# Patient Record
Sex: Female | Born: 1960
Health system: Southern US, Community
[De-identification: ages and names within clinical notes are randomized; demographics above are authoritative.]

## PROBLEM LIST (undated history)

## (undated) DIAGNOSIS — M25473 Effusion, unspecified ankle: Secondary | ICD-10-CM

## (undated) DIAGNOSIS — E079 Disorder of thyroid, unspecified: Secondary | ICD-10-CM

## (undated) DIAGNOSIS — E039 Hypothyroidism, unspecified: Secondary | ICD-10-CM

## (undated) DIAGNOSIS — R519 Headache, unspecified: Secondary | ICD-10-CM

## (undated) DIAGNOSIS — N879 Dysplasia of cervix uteri, unspecified: Secondary | ICD-10-CM

## (undated) DIAGNOSIS — Z973 Presence of spectacles and contact lenses: Secondary | ICD-10-CM

## (undated) HISTORY — DX: Disorder of thyroid, unspecified: E07.9

## (undated) HISTORY — DX: Effusion, unspecified ankle: M25.473

## (undated) HISTORY — DX: Dysplasia of cervix uteri, unspecified: N87.9

## (undated) HISTORY — PX: TUBAL LIGATION: SHX77

---

## 1995-03-30 HISTORY — PX: CERVICAL CONE BIOPSY: SUR198

## 1999-01-29 ENCOUNTER — Other Ambulatory Visit: Admission: RE | Admit: 1999-01-29 | Discharge: 1999-01-29 | Payer: Self-pay | Admitting: Obstetrics and Gynecology

## 1999-03-09 ENCOUNTER — Other Ambulatory Visit: Admission: RE | Admit: 1999-03-09 | Discharge: 1999-03-09 | Payer: Self-pay | Admitting: Obstetrics and Gynecology

## 2012-03-29 DIAGNOSIS — D649 Anemia, unspecified: Secondary | ICD-10-CM

## 2012-03-29 HISTORY — DX: Anemia, unspecified: D64.9

## 2012-10-26 ENCOUNTER — Other Ambulatory Visit (HOSPITAL_COMMUNITY)
Admission: RE | Admit: 2012-10-26 | Discharge: 2012-10-26 | Disposition: A | Discharge status: Home / Self Care | Payer: BC Managed Care – PPO | Source: Ambulatory Visit | Attending: Gynecology | Admitting: Gynecology

## 2012-10-26 ENCOUNTER — Ambulatory Visit (INDEPENDENT_AMBULATORY_CARE_PROVIDER_SITE_OTHER): Payer: BC Managed Care – PPO | Admitting: Gynecology

## 2012-10-26 ENCOUNTER — Encounter: Payer: Self-pay | Admitting: Gynecology

## 2012-10-26 VITALS — BP 124/78 | Ht 69.0 in | Wt 242.0 lb

## 2012-10-26 DIAGNOSIS — Z13220 Encounter for screening for lipoid disorders: Secondary | ICD-10-CM

## 2012-10-26 DIAGNOSIS — Z1151 Encounter for screening for human papillomavirus (HPV): Secondary | ICD-10-CM | POA: Insufficient documentation

## 2012-10-26 DIAGNOSIS — N951 Menopausal and female climacteric states: Secondary | ICD-10-CM

## 2012-10-26 DIAGNOSIS — Z01419 Encounter for gynecological examination (general) (routine) without abnormal findings: Secondary | ICD-10-CM | POA: Insufficient documentation

## 2012-10-26 DIAGNOSIS — E039 Hypothyroidism, unspecified: Secondary | ICD-10-CM

## 2012-10-26 LAB — COMPREHENSIVE METABOLIC PANEL
BUN: 10 mg/dL (ref 6–23)
CO2: 23 mEq/L (ref 19–32)
Calcium: 8.9 mg/dL (ref 8.4–10.5)
Chloride: 106 mEq/L (ref 96–112)
Creat: 0.88 mg/dL (ref 0.50–1.10)
Glucose, Bld: 89 mg/dL (ref 70–99)

## 2012-10-26 LAB — CBC WITH DIFFERENTIAL/PLATELET
Basophils Absolute: 0.1 10*3/uL (ref 0.0–0.1)
Eosinophils Relative: 3 % (ref 0–5)
HCT: 36 % (ref 36.0–46.0)
Lymphocytes Relative: 25 % (ref 12–46)
Lymphs Abs: 2.6 10*3/uL (ref 0.7–4.0)
MCV: 69.9 fL — ABNORMAL LOW (ref 78.0–100.0)
Monocytes Absolute: 0.5 10*3/uL (ref 0.1–1.0)
RDW: 18 % — ABNORMAL HIGH (ref 11.5–15.5)
WBC: 10.2 10*3/uL (ref 4.0–10.5)

## 2012-10-26 LAB — LIPID PANEL
Cholesterol: 164 mg/dL (ref 0–200)
HDL: 47 mg/dL (ref >39)
Total CHOL/HDL Ratio: 3.5 Ratio

## 2012-10-26 MED ORDER — LEVOTHYROXINE SODIUM 112 MCG PO TABS: 112.0000 ug | ORAL_TABLET | Freq: Every day | ORAL | Status: DC

## 2012-10-26 NOTE — Addendum Note (Signed)
Addended by: Dayna Barker on: 10/26/2012 10:40 AM   Modules accepted: Orders

## 2012-10-26 NOTE — Patient Instructions (Signed)
Schedule screening colonoscopy with [facility] gastroenterology or [facility] gastroenterology.   Call to Schedule your mammogram  Facilities in [place]: 1)  [facility], [place] Phone: [phone] 2)  [facility], [address] [id number] Phone: [phone] 3)  Dr. Yolanda Bonine at [facility] [address] Phone: [phone]     Mammogram A mammogram is an X-ray test to find changes in a woman's breast. You should get a mammogram if:  You are 63 years of age or older  You have risk factors.   Your doctor recommends that you have one.  BEFORE THE TEST  Do not schedule the test the week before your period, especially if your breasts are sore during this time.  On the day of your mammogram:  Wash your breasts and armpits well. After washing, do not put on any deodorant or talcum powder on until after your test.   Eat and drink as you usually do.   Take your medicines as usual.   If you are diabetic and take insulin, make sure you:   Eat before coming for your test.   Take your insulin as usual.   If you cannot keep your appointment, call before the appointment to cancel. Schedule another appointment.  TEST  You will need to undress from the waist up. You will put on a hospital gown.   Your breast will be put on the mammogram machine, and it will press firmly on your breast with a piece of plastic called a compression paddle. This will make your breast flatter so that the machine can X-ray all parts of your breast.   Both breasts will be X-rayed. Each breast will be X-rayed from above and from the side. An X-ray might need to be taken again if the picture is not good enough.   The mammogram will last about 15 to 30 minutes.  AFTER THE TEST Finding out the results of your test Ask when your test results will be ready. Make sure you get your test results.  Document Released:  06/11/2008 Document Revised: 03/04/2011 Document Reviewed: 06/11/2008 [facility] Patient Information 2012 [place], [place].

## 2012-10-26 NOTE — Progress Notes (Signed)
Sabrina Donaldson 06-17-1960 [id number]        52 y.o.  [id number] for annual exam.  Former patient of Dr. Leota Sauers. Patient without complaints.  Past medical history,surgical history, medications, allergies, family history and social history were all reviewed and documented in the EPIC chart.  ROS:  Performed and pertinent positives and negatives are included in the history, assessment and plan .  Exam: Kim assistant Filed Vitals:   10/26/12 1001  BP: 124/78  Height: 5\' 9"  (1.753 m)  Weight: 242 lb (109.77 kg)   General appearance  Normal Skin grossly normal Head/Neck normal with no cervical or supraclavicular adenopathy thyroid normal Lungs  clear Cardiac RR, without RMG Abdominal  soft, nontender, without masses, organomegaly or hernia Breasts  examined lying and sitting without masses, retractions, discharge or axillary adenopathy. Pelvic  Ext/BUS/vagina  normal   Cervix  normal Pap/HPV  Uterus  anteverted, normal size, shape and contour, midline and mobile nontender   Adnexa  Without masses or tenderness    Anus and perineum  normal   Rectovaginal  normal sphincter tone without palpated masses or tenderness.    Assessment/Plan:  52 y.o. [id number] female for annual exam, regular menses, tubal sterilization.   1. Mild intermittent sweats. Not overly bothersome. Regular menses. Discussed the perimenopausal time frame. Trial of OTC soy. Check FSH today to see where we stand. Keep menstrual calendar. As long as regular or less frequent but regular menses and will monitor. Prolonged or atypical bleeding or significant symptoms patient will followup. 2. Hypothyroidism. On Synthroid 112 mcg replacement for years. Refill x1 year provided. Check TSH today. 3. Pap smear/HPV today. History of cone biopsy 1997 with pathology showing CIN 2. Followup CIN-1 on colposcopic biopsies 2000. All subsequent Pap smears have been normal with last Pap smear 2013. Assuming this Pap smear/HPV negative  then we'll plan less frequent screening interval. 4. Mammography overdue. Patient knows the importance of scheduling and agrees to do so. SBE monthly reviewed. 5. Colonoscopy screening recommended with names given. 6. Health maintenance. Baseline CBC lipid profile comprehensive metabolic panel urinalysis ordered along with her FSH TSH. Followup in one year, sooner as needed. Patient did bring copies of her prior medical records which I reviewed and then returned to her.  Note: This document was prepared with digital dictation and possible smart phrase technology. Any transcriptional errors that result from this process are unintentional.   Sabrina Lords MD, 10:30 AM 10/26/2012

## 2012-10-27 LAB — URINALYSIS W MICROSCOPIC + REFLEX CULTURE
Bacteria, UA: NONE SEEN
Casts: NONE SEEN
Crystals: NONE SEEN
Glucose, UA: NEGATIVE mg/dL
Hgb urine dipstick: NEGATIVE
Ketones, ur: NEGATIVE mg/dL
Leukocytes, UA: NEGATIVE
pH: 6 (ref 5.0–8.0)

## 2012-10-27 LAB — FOLLICLE STIMULATING HORMONE: FSH: 3.3 m[IU]/mL

## 2012-11-09 ENCOUNTER — Other Ambulatory Visit: Payer: Self-pay | Admitting: Gynecology

## 2012-11-09 DIAGNOSIS — D649 Anemia, unspecified: Secondary | ICD-10-CM

## 2012-11-09 DIAGNOSIS — E039 Hypothyroidism, unspecified: Secondary | ICD-10-CM

## 2012-11-10 ENCOUNTER — Other Ambulatory Visit: Payer: Self-pay | Admitting: Gynecology

## 2012-11-10 MED ORDER — LEVOTHYROXINE SODIUM 125 MCG PO TABS: 125.0000 ug | ORAL_TABLET | Freq: Every day | ORAL | Status: DC

## 2013-02-01 ENCOUNTER — Other Ambulatory Visit: Payer: Self-pay

## 2013-03-07 ENCOUNTER — Other Ambulatory Visit: Payer: BC Managed Care – PPO

## 2013-03-07 DIAGNOSIS — D649 Anemia, unspecified: Secondary | ICD-10-CM

## 2013-03-07 DIAGNOSIS — E039 Hypothyroidism, unspecified: Secondary | ICD-10-CM

## 2013-03-07 LAB — CBC WITH DIFFERENTIAL/PLATELET
Eosinophils Absolute: 0.4 10*3/uL (ref 0.0–0.7)
Eosinophils Relative: 4 % (ref 0–5)
Lymphs Abs: 2.1 10*3/uL (ref 0.7–4.0)
MCH: 23.5 pg — ABNORMAL LOW (ref 26.0–34.0)
MCV: 72.2 fL — ABNORMAL LOW (ref 78.0–100.0)
Monocytes Relative: 5 % (ref 3–12)
Platelets: 440 10*3/uL — ABNORMAL HIGH (ref 150–400)
RBC: 4.97 MIL/uL (ref 3.87–5.11)

## 2013-04-03 ENCOUNTER — Telehealth: Payer: Self-pay

## 2013-04-03 NOTE — Telephone Encounter (Signed)
Message copied by Keenan BachelorANNAS, Virdia Ziesmer R on Tue Apr 03, 2013 11:59 AM ------      Message from: Dara LordsFONTAINE, TIMOTHY P      Created: Wed Mar 07, 2013  4:35 PM       Tell patient that her thyroid is better. Her hemoglobin is still low. Is she taking iron every day? ------

## 2013-04-03 NOTE — Telephone Encounter (Signed)
Patient informed. 

## 2013-04-03 NOTE — Telephone Encounter (Signed)
Patient said when you told her previously her iron was low she was not very concerned because it had never been low before. Always 12 or 13.  She said her mother had MDS (?).  Patient is now concerned since it is continuing to be abnormal and questions what would suddenly cause her to be anemic after always having had normal hgb?    She has not been taking her iron regularly, only occasionally when she thinks of it.

## 2013-04-03 NOTE — Telephone Encounter (Signed)
It was 11.7 which isn't much difference from 12. I suspect is because of her menses and ongoing blood loss and not keeping up with the iron intake. Again my recommendation is to start on over-the-counter iron supplement daily and to stay on it and then repeat her blood count in July when she comes in for her annual. I would also recommend that she schedule her screening colonoscopy as her intestines can be a source of blood loss also. But overall I think it's just that she has not been taking iron and keeping up with her blood loss from her menses.

## 2013-04-13 ENCOUNTER — Other Ambulatory Visit: Payer: Self-pay | Admitting: Gynecology

## 2013-07-09 ENCOUNTER — Other Ambulatory Visit: Payer: Self-pay | Admitting: Gynecology

## 2013-07-09 DIAGNOSIS — Z1231 Encounter for screening mammogram for malignant neoplasm of breast: Secondary | ICD-10-CM

## 2013-07-10 ENCOUNTER — Ambulatory Visit (HOSPITAL_COMMUNITY): Payer: BC Managed Care – PPO

## 2013-07-12 ENCOUNTER — Ambulatory Visit (HOSPITAL_COMMUNITY)
Admission: RE | Admit: 2013-07-12 | Discharge: 2013-07-12 | Disposition: A | Discharge status: Home / Self Care | Payer: BC Managed Care – PPO | Source: Ambulatory Visit | Attending: Gynecology | Admitting: Gynecology

## 2013-07-12 ENCOUNTER — Other Ambulatory Visit: Payer: Self-pay | Admitting: Gynecology

## 2013-07-12 DIAGNOSIS — Z1231 Encounter for screening mammogram for malignant neoplasm of breast: Secondary | ICD-10-CM

## 2013-07-31 ENCOUNTER — Encounter: Payer: Self-pay | Admitting: Gynecology

## 2013-07-31 ENCOUNTER — Ambulatory Visit (INDEPENDENT_AMBULATORY_CARE_PROVIDER_SITE_OTHER): Payer: BC Managed Care – PPO | Admitting: Gynecology

## 2013-07-31 ENCOUNTER — Telehealth: Payer: Self-pay | Admitting: *Deleted

## 2013-07-31 DIAGNOSIS — N390 Urinary tract infection, site not specified: Secondary | ICD-10-CM

## 2013-07-31 DIAGNOSIS — R350 Frequency of micturition: Secondary | ICD-10-CM

## 2013-07-31 DIAGNOSIS — B3731 Acute candidiasis of vulva and vagina: Secondary | ICD-10-CM

## 2013-07-31 DIAGNOSIS — B373 Candidiasis of vulva and vagina: Secondary | ICD-10-CM

## 2013-07-31 LAB — URINALYSIS W MICROSCOPIC + REFLEX CULTURE
BILIRUBIN URINE: NEGATIVE
CRYSTALS: NONE SEEN
Casts: NONE SEEN
GLUCOSE, UA: NEGATIVE mg/dL
KETONES UR: NEGATIVE mg/dL
NITRITE: POSITIVE — AB
PH: 5 (ref 5.0–8.0)
Protein, ur: 30 mg/dL — AB
SPECIFIC GRAVITY, URINE: 1.015 (ref 1.005–1.030)
Urobilinogen, UA: 0.2 mg/dL (ref 0.0–1.0)

## 2013-07-31 MED ORDER — FLUCONAZOLE 150 MG PO TABS
150.0000 mg | ORAL_TABLET | Freq: Once | ORAL | Status: DC
Start: 2013-07-31 — End: 2013-10-31

## 2013-07-31 MED ORDER — BUSPIRONE HCL 10 MG PO TABS: 10.0000 mg | ORAL_TABLET | Freq: Every day | ORAL | Status: DC

## 2013-07-31 MED ORDER — SULFAMETHOXAZOLE-TMP DS 800-160 MG PO TABS: 1.0000 | ORAL_TABLET | Freq: Two times a day (BID) | ORAL | Status: DC

## 2013-07-31 NOTE — Patient Instructions (Signed)
Take the Septra antibiotic twice daily for 3 days. Call if her symptoms persist, worsen or recur. Take the Diflucan pill once. Start the BuSpar 10 mg daily. If you have worsening anxiety or depression call. Followup in July when you're due for your annual exam.

## 2013-07-31 NOTE — Progress Notes (Signed)
Desmond LopeDonna T Harriott Feb 24, 1961 [id number]        53 y.o.  [id number] presents complaining of one to 2 days of frequency urgency and mild dysuria. She is getting worse. No fever chills nausea vomiting diarrhea constipation. No low back pain.  Past medical history,surgical history, problem list, medications, allergies, family history and social history were all reviewed and documented in the EPIC chart.  Directed ROS with pertinent positives and negatives documented in the history of present illness/assessment and plan.  Exam: General appearance  Normal Spine straight without CVA tenderness Abdomen soft nontender without masses guarding rebound organomegaly.  Assessment/Plan:  53 y.o. [id number] with symptoms and urinalysis consistent with UTI. UA also shows yeast. Will treat with Septra DS 1 by mouth twice a day x3 days and Diflucan 150 mg x1 day. Followup if symptoms persist worsen or recur. Patient asked if I could prescribe BuSpar 10 mg daily. She had taken this from Dr. Elana AlmMcPhail for anxiety/PMS type symptoms. She has some anxious feelings but these seem to get worse before her menses. A low dose of daily BuSpar apparently helped her. BuSpar 10 mg #30 refill x3 provided. Patient will make appointment for her annual in July, sooner as needed.   Note: This document was prepared with digital dictation and possible smart phrase technology. Any transcriptional errors that result from this process are unintentional.   Dara Lordsimothy P Jeralyn Nolden MD, 3:15 PM 07/31/2013

## 2013-07-31 NOTE — Telephone Encounter (Signed)
Per Epic rx went electronically.

## 2013-07-31 NOTE — Telephone Encounter (Signed)
Message copied by Aura CampsWEBB, JENNIFER L on Tue Jul 31, 2013  3:32 PM ------      Message from: Dara LordsFONTAINE, TIMOTHY P      Created: Tue Jul 31, 2013  3:18 PM       Prescription through to her pharmacy for BuSpar 10 mg daily #30 with 2 refills. I'm not sure if that can go through electronically. If not then called it to her pharmacy. ------

## 2013-08-02 LAB — URINE CULTURE

## 2013-08-06 ENCOUNTER — Other Ambulatory Visit: Payer: Self-pay | Admitting: Gynecology

## 2013-08-06 DIAGNOSIS — R3129 Other microscopic hematuria: Secondary | ICD-10-CM

## 2013-08-16 ENCOUNTER — Other Ambulatory Visit: Payer: BC Managed Care – PPO

## 2013-08-16 DIAGNOSIS — R3129 Other microscopic hematuria: Secondary | ICD-10-CM

## 2013-08-16 LAB — URINALYSIS W MICROSCOPIC + REFLEX CULTURE
BACTERIA UA: NONE SEEN
Bilirubin Urine: NEGATIVE
Casts: NONE SEEN
Crystals: NONE SEEN
GLUCOSE, UA: NEGATIVE mg/dL
HGB URINE DIPSTICK: NEGATIVE
KETONES UR: NEGATIVE mg/dL
Leukocytes, UA: NEGATIVE
Nitrite: NEGATIVE
PROTEIN: NEGATIVE mg/dL
Specific Gravity, Urine: 1.008 (ref 1.005–1.030)
UROBILINOGEN UA: 1 mg/dL (ref 0.0–1.0)
pH: 6.5 (ref 5.0–8.0)

## 2013-10-31 ENCOUNTER — Ambulatory Visit (INDEPENDENT_AMBULATORY_CARE_PROVIDER_SITE_OTHER): Payer: BC Managed Care – PPO | Admitting: Gynecology

## 2013-10-31 ENCOUNTER — Other Ambulatory Visit (HOSPITAL_COMMUNITY)
Admission: RE | Admit: 2013-10-31 | Discharge: 2013-10-31 | Disposition: A | Discharge status: Home / Self Care | Payer: BC Managed Care – PPO | Source: Ambulatory Visit | Attending: Gynecology | Admitting: Gynecology

## 2013-10-31 ENCOUNTER — Encounter: Payer: Self-pay | Admitting: Gynecology

## 2013-10-31 VITALS — BP 120/74 | Ht 69.0 in | Wt 267.0 lb

## 2013-10-31 DIAGNOSIS — D508 Other iron deficiency anemias: Secondary | ICD-10-CM

## 2013-10-31 DIAGNOSIS — N841 Polyp of cervix uteri: Secondary | ICD-10-CM

## 2013-10-31 DIAGNOSIS — Z01419 Encounter for gynecological examination (general) (routine) without abnormal findings: Secondary | ICD-10-CM | POA: Insufficient documentation

## 2013-10-31 DIAGNOSIS — N926 Irregular menstruation, unspecified: Secondary | ICD-10-CM

## 2013-10-31 DIAGNOSIS — E039 Hypothyroidism, unspecified: Secondary | ICD-10-CM

## 2013-10-31 LAB — CBC WITH DIFFERENTIAL/PLATELET
BASOS PCT: 1 % (ref 0–1)
Basophils Absolute: 0.1 10*3/uL (ref 0.0–0.1)
Eosinophils Absolute: 0.4 10*3/uL (ref 0.0–0.7)
Eosinophils Relative: 4 % (ref 0–5)
HCT: 39.7 % (ref 36.0–46.0)
HEMOGLOBIN: 13.3 g/dL (ref 12.0–15.0)
LYMPHS ABS: 2.5 10*3/uL (ref 0.7–4.0)
Lymphocytes Relative: 26 % (ref 12–46)
MCH: 25.5 pg — ABNORMAL LOW (ref 26.0–34.0)
MCHC: 33.5 g/dL (ref 30.0–36.0)
MCV: 76.1 fL — ABNORMAL LOW (ref 78.0–100.0)
MONOS PCT: 5 % (ref 3–12)
Monocytes Absolute: 0.5 10*3/uL (ref 0.1–1.0)
NEUTROS PCT: 64 % (ref 43–77)
Neutro Abs: 6.2 10*3/uL (ref 1.7–7.7)
Platelets: 412 10*3/uL — ABNORMAL HIGH (ref 150–400)
RBC: 5.22 MIL/uL — AB (ref 3.87–5.11)
RDW: 15.9 % — ABNORMAL HIGH (ref 11.5–15.5)
WBC: 9.7 10*3/uL (ref 4.0–10.5)

## 2013-10-31 LAB — COMPREHENSIVE METABOLIC PANEL
ALBUMIN: 3.6 g/dL (ref 3.5–5.2)
ALK PHOS: 78 U/L (ref 39–117)
ALT: 8 U/L (ref 0–35)
AST: 9 U/L (ref 0–37)
BUN: 14 mg/dL (ref 6–23)
CO2: 23 mEq/L (ref 19–32)
Calcium: 8.8 mg/dL (ref 8.4–10.5)
Chloride: 105 mEq/L (ref 96–112)
Creat: 0.87 mg/dL (ref 0.50–1.10)
GLUCOSE: 88 mg/dL (ref 70–99)
POTASSIUM: 4.5 meq/L (ref 3.5–5.3)
SODIUM: 139 meq/L (ref 135–145)
Total Bilirubin: 0.5 mg/dL (ref 0.2–1.2)
Total Protein: 6.9 g/dL (ref 6.0–8.3)

## 2013-10-31 LAB — IRON AND TIBC
%SAT: 15 % — AB (ref 20–55)
IRON: 49 ug/dL (ref 42–145)
TIBC: 333 ug/dL (ref 250–470)
UIBC: 284 ug/dL (ref 125–400)

## 2013-10-31 LAB — LIPID PANEL
Cholesterol: 150 mg/dL (ref 0–200)
HDL: 41 mg/dL (ref >39)
LDL CALC: 85 mg/dL (ref 0–99)
TRIGLYCERIDES: 120 mg/dL (ref <150)
Total CHOL/HDL Ratio: 3.7 Ratio
VLDL: 24 mg/dL (ref 0–40)

## 2013-10-31 LAB — TSH: TSH: 0.406 u[IU]/mL (ref 0.350–4.500)

## 2013-10-31 MED ORDER — BUSPIRONE HCL 10 MG PO TABS: 10.0000 mg | ORAL_TABLET | Freq: Every day | ORAL | Status: DC

## 2013-10-31 MED ORDER — LEVOTHYROXINE SODIUM 125 MCG PO TABS: ORAL_TABLET | ORAL | Status: DC

## 2013-10-31 NOTE — Progress Notes (Signed)
Sabrina Donaldson [birth year] [id number]        53 y.o.  [id number] for annual exam.  Several issues noted below.  Past medical history,surgical history, problem list, medications, allergies, family history and social history were all reviewed and documented as reviewed in the EPIC chart.  ROS:  12 system ROS performed with pertinent positives and negatives included in the history, assessment and plan.   Additional significant findings :  None   Exam: Sabrina Donaldson Filed Vitals:   10/31/13 0942  BP: 120/74  Height:  (1.753 m)  Weight: 267 lb (121.11 kg)   General appearance:  Normal affect, orientation and appearance. Skin: Grossly normal HEENT: Without gross lesions.  No cervical or supraclavicular adenopathy. Thyroid normal.  Lungs:  Clear without wheezing, rales or rhonchi Cardiac: RR, without RMG Abdominal:  Soft, nontender, without masses, guarding, rebound, organomegaly or hernia Breasts:  Examined lying and sitting without masses, retractions, discharge or axillary adenopathy. Pelvic:  Ext/BUS/vagina normal  Cervix with endocervical polyp extruding from the external os. Pap done.  Uterus axial to anteverted, normal size, shape and contour, midline and mobile nontender   Adnexa  Without masses or tenderness    Anus and perineum  Normal   Rectovaginal  Normal sphincter tone without palpated masses or tenderness.   Procedure: The cervical polyp was biopsied off and sent to pathology. Silver nitrate hemostasis applied afterwards.  Assessment/Plan:  53 y.o. G70P2002 female for annual exam with mild menstrual irregularity, tubal sterilization.   1. Mild menstrual irregularity. Patient having menses monthly until last month when she went 6 weeks and then started in July. Not having significant symptoms such as hot flushes, night sweats or other menopausal symptoms. Will check baseline FSH and TSH. Monitor cycles for now. Report any significant irregularity or long  skips. 2. Cervical polyp. Biopsied off. Patient will followup for pathology results. 3. History of anemia. Hemoglobin 11 last year with microcytic hypochromic indices. Recheck CBC this year as well as iron study. Patient admits to not taking iron regularly. I again encouraged her to take a daily iron supplement. 4. Pap smear/HPV negative 2014. History of cone biopsy 1997 with pathology showing CIN-2. Followup CIN-1 on colposcopic biopsies 2000. All Pap smears negative since then. Pap smear done today because of the polyp. Assuming negative then plan less frequent screening intervals per current screening guidelines. 5. Hypothyroid. Patient on Synthroid 125 mcg. Refill x1 year provided. Check TSH. 6. Anxiety. Patient was recently started on BuSpar 10 mg and reports doing well with this and wants to continue. Refill x1 year provided. 7. Mammography 06/2013. Continue with annual mammography. SBE monthly reviewed. 8. Colonoscopy not yet. Again recommended she schedule screening colonoscopy and she acknowledges my recommendation. 9. Health maintenance. Baseline CBC comprehensive metabolic panel lipid profile urinalysis ordered along with her FSH TSH and iron studies. Followup for biopsy and lab results otherwise annually, sooner if any issues.   Note: This document was prepared with digital dictation and possible smart phrase technology. Any transcriptional errors that result from this process are unintentional.   Sabrina Donaldson, 10:12 AM 10/31/2013

## 2013-10-31 NOTE — Addendum Note (Signed)
Addended by: Dayna BarkerGARDNER, Salil Raineri K on: 10/31/2013 10:32 AM   Modules accepted: Orders

## 2013-10-31 NOTE — Patient Instructions (Signed)
Office will call you with the biopsy results. Followup for lab results. Schedule colonoscopy with [place] gastroenterology at [phone] or [facility] gastroenterology at [phone]  You may obtain a copy of any labs that were done today by logging onto MyChart as outlined in the instructions provided with your AVS (after visit summary). The office will not call with normal lab results but certainly if there are any significant abnormalities then we will contact you.   Health Maintenance, Female A healthy lifestyle and preventative care can promote health and wellness.  Maintain regular health, dental, and eye exams.  Eat a healthy diet. Foods like vegetables, fruits, whole grains, low-fat dairy products, and lean protein foods contain the nutrients you need without too many calories. Decrease your intake of foods high in solid fats, added sugars, and salt. Get information about a proper diet from your caregiver, if necessary.  Regular physical exercise is one of the most important things you can do for your health. Most adults should get at least 150 minutes of moderate-intensity exercise (any activity that increases your heart rate and causes you to sweat) each week. In addition, most adults need muscle-strengthening exercises on 2 or more days a week.   Maintain a healthy weight. The body mass index (BMI) is a screening tool to identify possible weight problems. It provides an estimate of body fat based on height and weight. Your caregiver can help determine your BMI, and can help you achieve or maintain a healthy weight. For adults 20 years and older:  A BMI below 18.5 is considered underweight.  A BMI of 18.5 to 24.9 is normal.  A BMI of 25 to 29.9 is considered overweight.  A BMI of 30 and above is considered obese.  Maintain normal blood lipids and cholesterol by exercising and minimizing your intake of saturated fat. Eat a balanced diet with plenty of fruits and vegetables. Blood  tests for lipids and cholesterol should begin at age 86 and be repeated every 5 years. If your lipid or cholesterol levels are high, you are over 50, or you are a high risk for heart disease, you may need your cholesterol levels checked more frequently.Ongoing high lipid and cholesterol levels should be treated with medicines if diet and exercise are not effective.  If you smoke, find out from your caregiver how to quit. If you do not use tobacco, do not start.  Lung cancer screening is recommended for adults aged 49 80 years who are at high risk for developing lung cancer because of a history of smoking. Yearly low-dose computed tomography (CT) is recommended for people who have at least a 30-pack-year history of smoking and are a current smoker or have quit within the past 15 years. A pack year of smoking is smoking an average of 1 pack of cigarettes a day for 1 year (for example: 1 pack a day for 30 years or 2 packs a day for 15 years). Yearly screening should continue until the smoker has stopped smoking for at least 15 years. Yearly screening should also be stopped for people who develop a health problem that would prevent them from having lung cancer treatment.  If you are pregnant, do not drink alcohol. If you are breastfeeding, be very cautious about drinking alcohol. If you are not pregnant and choose to drink alcohol, do not exceed 1 drink per day. One drink is considered to be 12 ounces (355 mL) of beer, 5 ounces (148 mL) of wine, or 1.5 ounces (44 mL)  of liquor.  Avoid use of street drugs. Do not share needles with anyone. Ask for help if you need support or instructions about stopping the use of drugs.  High blood pressure causes heart disease and increases the risk of stroke. Blood pressure should be checked at least every 1 to 2 years. Ongoing high blood pressure should be treated with medicines, if weight loss and exercise are not effective.  If you are 50 to 51 years old, ask your  caregiver if you should take aspirin to prevent strokes.  Diabetes screening involves taking a blood sample to check your fasting blood sugar level. This should be done once every 3 years, after age 8, if you are within normal weight and without risk factors for diabetes. Testing should be considered at a younger age or be carried out more frequently if you are overweight and have at least 1 risk factor for diabetes.  Breast cancer screening is essential preventative care for women. You should practice "breast self-awareness." This means understanding the normal appearance and feel of your breasts and may include breast self-examination. Any changes detected, no matter how small, should be reported to a caregiver. Women in their 40s and 30s should have a clinical breast exam (CBE) by a caregiver as part of a regular health exam every 1 to 3 years. After age 66, women should have a CBE every year. Starting at age 10, women should consider having a mammogram (breast X-ray) every year. Women who have a family history of breast cancer should talk to their caregiver about genetic screening. Women at a high risk of breast cancer should talk to their caregiver about having an MRI and a mammogram every year.  Breast cancer gene (BRCA)-related cancer risk assessment is recommended for women who have family members with BRCA-related cancers. BRCA-related cancers include breast, ovarian, tubal, and peritoneal cancers. Having family members with these cancers may be associated with an increased risk for harmful changes (mutations) in the breast cancer genes BRCA1 and BRCA2. Results of the assessment will determine the need for genetic counseling and BRCA1 and BRCA2 testing.  The Pap test is a screening test for cervical cancer. Women should have a Pap test starting at age 43. Between ages 35 and 31, Pap tests should be repeated every 2 years. Beginning at age 87, you should have a Pap test every 3 years as long as the  past 3 Pap tests have been normal. If you had a hysterectomy for a problem that was not cancer or a condition that could lead to cancer, then you no longer need Pap tests. If you are between ages 57 and 82, and you have had normal Pap tests going back 10 years, you no longer need Pap tests. If you have had past treatment for cervical cancer or a condition that could lead to cancer, you need Pap tests and screening for cancer for at least 20 years after your treatment. If Pap tests have been discontinued, risk factors (such as a new sexual partner) need to be reassessed to determine if screening should be resumed. Some women have medical problems that increase the chance of getting cervical cancer. In these cases, your caregiver may recommend more frequent screening and Pap tests.  The human papillomavirus (HPV) test is an additional test that may be used for cervical cancer screening. The HPV test looks for the virus that can cause the cell changes on the cervix. The cells collected during the Pap test can be tested  for HPV. The HPV test could be used to screen women aged 44 years and older, and should be used in women of any age who have unclear Pap test results. After the age of 39, women should have HPV testing at the same frequency as a Pap test.  Colorectal cancer can be detected and often prevented. Most routine colorectal cancer screening begins at the age of 67 and continues through age 9. However, your caregiver may recommend screening at an earlier age if you have risk factors for colon cancer. On a yearly basis, your caregiver may provide home test kits to check for hidden blood in the stool. Use of a small camera at the end of a tube, to directly examine the colon (sigmoidoscopy or colonoscopy), can detect the earliest forms of colorectal cancer. Talk to your caregiver about this at age 80, when routine screening begins. Direct examination of the colon should be repeated every 5 to 10 years through  age 84, unless early forms of pre-cancerous polyps or small growths are found.  Hepatitis C blood testing is recommended for all people born from [birth year] through 1963 and any individual with known risks for hepatitis C.  Practice safe sex. Use condoms and avoid high-risk sexual practices to reduce the spread of sexually transmitted infections (STIs). Sexually active women aged 19 and younger should be checked for Chlamydia, which is a common sexually transmitted infection. Older women with new or multiple partners should also be tested for Chlamydia. Testing for other STIs is recommended if you are sexually active and at increased risk.  Osteoporosis is a disease in which the bones lose minerals and strength with aging. This can result in serious bone fractures. The risk of osteoporosis can be identified using a bone density scan. Women ages 68 and over and women at risk for fractures or osteoporosis should discuss screening with their caregivers. Ask your caregiver whether you should be taking a calcium supplement or vitamin D to reduce the rate of osteoporosis.  Menopause can be associated with physical symptoms and risks. Hormone replacement therapy is available to decrease symptoms and risks. You should talk to your caregiver about whether hormone replacement therapy is right for you.  Use sunscreen. Apply sunscreen liberally and repeatedly throughout the day. You should seek shade when your shadow is shorter than you. Protect yourself by wearing long sleeves, pants, a wide-brimmed hat, and sunglasses year round, whenever you are outdoors.  Notify your caregiver of new moles or changes in moles, especially if there is a change in shape or color. Also notify your caregiver if a mole is larger than the size of a pencil eraser.  Stay current with your immunizations. Document Released: 09/28/2010 Document Revised: 07/10/2012 Document Reviewed: 09/28/2010 [facility] Patient Information 2014 [place].

## 2013-11-01 LAB — URINALYSIS W MICROSCOPIC + REFLEX CULTURE
Bacteria, UA: NONE SEEN
Bilirubin Urine: NEGATIVE
Casts: NONE SEEN
Crystals: NONE SEEN
Glucose, UA: NEGATIVE mg/dL
Hgb urine dipstick: NEGATIVE
Ketones, ur: NEGATIVE mg/dL
LEUKOCYTES UA: NEGATIVE
Nitrite: NEGATIVE
PROTEIN: NEGATIVE mg/dL
Specific Gravity, Urine: 1.012 (ref 1.005–1.030)
Squamous Epithelial / LPF: NONE SEEN
UROBILINOGEN UA: 0.2 mg/dL (ref 0.0–1.0)
pH: 7 (ref 5.0–8.0)

## 2013-11-01 LAB — FOLLICLE STIMULATING HORMONE: FSH: 3.5 m[IU]/mL

## 2013-11-01 LAB — VITAMIN D 25 HYDROXY (VIT D DEFICIENCY, FRACTURES): Vit D, 25-Hydroxy: 37 ng/mL (ref 30–89)

## 2013-11-01 LAB — CYTOLOGY - PAP

## 2014-01-11 ENCOUNTER — Other Ambulatory Visit: Payer: Self-pay

## 2014-01-28 ENCOUNTER — Encounter: Payer: Self-pay | Admitting: Gynecology

## 2014-09-20 ENCOUNTER — Telehealth: Payer: Self-pay | Admitting: *Deleted

## 2014-09-20 MED ORDER — BETAMETHASONE DIPROPIONATE 0.05 % EX CREA: TOPICAL_CREAM | CUTANEOUS | Status: DC

## 2014-09-20 NOTE — Telephone Encounter (Signed)
Diprolene 0.05% cream 30 g apply 3 times a day

## 2014-09-20 NOTE — Telephone Encounter (Signed)
Pt called c/o poison ivy asked if you are willing to send Rx in for this? Or should pt go to urgent care, states you are PCP. Please advise

## 2014-09-20 NOTE — Telephone Encounter (Signed)
Pt informed, Rx sent. 

## 2014-10-07 ENCOUNTER — Other Ambulatory Visit: Payer: Self-pay

## 2014-10-07 MED ORDER — BUSPIRONE HCL 10 MG PO TABS
10.0000 mg | ORAL_TABLET | Freq: Every day | ORAL | Status: DC
Start: 2014-10-07 — End: 2014-11-07

## 2014-11-07 ENCOUNTER — Other Ambulatory Visit: Payer: Self-pay | Admitting: Gynecology

## 2014-11-07 NOTE — Telephone Encounter (Signed)
CE is scheduled 12/20/14.

## 2014-12-20 ENCOUNTER — Ambulatory Visit (INDEPENDENT_AMBULATORY_CARE_PROVIDER_SITE_OTHER): Payer: BLUE CROSS/BLUE SHIELD | Admitting: Gynecology

## 2014-12-20 ENCOUNTER — Encounter: Payer: Self-pay | Admitting: Gynecology

## 2014-12-20 VITALS — BP 124/76 | Ht 69.0 in | Wt 271.0 lb

## 2014-12-20 DIAGNOSIS — M7989 Other specified soft tissue disorders: Secondary | ICD-10-CM

## 2014-12-20 DIAGNOSIS — F411 Generalized anxiety disorder: Secondary | ICD-10-CM

## 2014-12-20 DIAGNOSIS — Z01419 Encounter for gynecological examination (general) (routine) without abnormal findings: Secondary | ICD-10-CM | POA: Diagnosis not present

## 2014-12-20 DIAGNOSIS — E038 Other specified hypothyroidism: Secondary | ICD-10-CM | POA: Diagnosis not present

## 2014-12-20 LAB — CBC WITH DIFFERENTIAL/PLATELET
BASOS PCT: 1 % (ref 0–1)
Basophils Absolute: 0.1 10*3/uL (ref 0.0–0.1)
EOS ABS: 0.3 10*3/uL (ref 0.0–0.7)
Eosinophils Relative: 3 % (ref 0–5)
HCT: 43.3 % (ref 36.0–46.0)
HEMOGLOBIN: 14.1 g/dL (ref 12.0–15.0)
Lymphocytes Relative: 21 % (ref 12–46)
Lymphs Abs: 2.2 10*3/uL (ref 0.7–4.0)
MCH: 26.8 pg (ref 26.0–34.0)
MCHC: 32.6 g/dL (ref 30.0–36.0)
MCV: 82.2 fL (ref 78.0–100.0)
MONOS PCT: 5 % (ref 3–12)
MPV: 9.1 fL (ref 8.6–12.4)
Monocytes Absolute: 0.5 10*3/uL (ref 0.1–1.0)
Neutro Abs: 7.4 10*3/uL (ref 1.7–7.7)
Neutrophils Relative %: 70 % (ref 43–77)
PLATELETS: 411 10*3/uL — AB (ref 150–400)
RBC: 5.27 MIL/uL — ABNORMAL HIGH (ref 3.87–5.11)
RDW: 14.5 % (ref 11.5–15.5)
WBC: 10.6 10*3/uL — ABNORMAL HIGH (ref 4.0–10.5)

## 2014-12-20 LAB — COMPREHENSIVE METABOLIC PANEL
ALBUMIN: 3.5 g/dL — AB (ref 3.6–5.1)
ALT: 10 U/L (ref 6–29)
AST: 9 U/L — ABNORMAL LOW (ref 10–35)
Alkaline Phosphatase: 89 U/L (ref 33–130)
BUN: 16 mg/dL (ref 7–25)
CO2: 27 mmol/L (ref 20–31)
CREATININE: 0.85 mg/dL (ref 0.50–1.05)
Calcium: 9 mg/dL (ref 8.6–10.4)
Chloride: 105 mmol/L (ref 98–110)
Glucose, Bld: 94 mg/dL (ref 65–99)
Potassium: 4.8 mmol/L (ref 3.5–5.3)
SODIUM: 139 mmol/L (ref 135–146)
Total Bilirubin: 0.4 mg/dL (ref 0.2–1.2)
Total Protein: 6.6 g/dL (ref 6.1–8.1)

## 2014-12-20 LAB — LIPID PANEL
Cholesterol: 157 mg/dL (ref 125–200)
HDL: 44 mg/dL — AB (ref >=46)
LDL Cholesterol: 92 mg/dL (ref <130)
Total CHOL/HDL Ratio: 3.6 Ratio (ref <=5.0)
Triglycerides: 103 mg/dL (ref <150)
VLDL: 21 mg/dL (ref <30)

## 2014-12-20 LAB — TSH: TSH: 0.061 u[IU]/mL — ABNORMAL LOW (ref 0.350–4.500)

## 2014-12-20 MED ORDER — LEVOTHYROXINE SODIUM 125 MCG PO TABS: ORAL_TABLET | ORAL | Status: DC

## 2014-12-20 MED ORDER — BUSPIRONE HCL 10 MG PO TABS: 10.0000 mg | ORAL_TABLET | Freq: Every day | ORAL | Status: DC

## 2014-12-20 NOTE — Patient Instructions (Addendum)
Make an appointment with a primary physician to evaluate your swelling.  Call to Schedule your mammogram  Facilities in [place]: 1)  [facility], [place] Phone: [phone] 2)  [facility]. [address] [place] Phone: [phone] 3)  Dr. Isaiah Blakes at [facility] [address] Phone: [phone]     Mammogram A mammogram is an X-ray test to find changes in a woman's breast. You should get a mammogram if:  You are 25 years of age or older  You have risk factors.   Your doctor recommends that you have one.  BEFORE THE TEST  Do not schedule the test the week before your period, especially if your breasts are sore during this time.  On the day of your mammogram:  Wash your breasts and armpits well. After washing, do not put on any deodorant or talcum powder on until after your test.   Eat and drink as you usually do.   Take your medicines as usual.   If you are diabetic and take insulin, make sure you:   Eat before coming for your test.   Take your insulin as usual.   If you cannot keep your appointment, call before the appointment to cancel. Schedule another appointment.  TEST  You will need to undress from the waist up. You will put on a hospital gown.   Your breast will be put on the mammogram machine, and it will press firmly on your breast with a piece of plastic called a compression paddle. This will make your breast flatter so that the machine can X-ray all parts of your breast.   Both breasts will be X-rayed. Each breast will be X-rayed from above and from the side. An X-ray might need to be taken again if the picture is not good enough.   The mammogram will last about 15 to 30 minutes.  AFTER THE TEST Finding out the results of your test Ask when your test results will be ready. Make sure you get your test results.  Document Released: 06/11/2008  Document Revised: 03/04/2011 Document Reviewed: 06/11/2008 [facility] Patient Information 2012 [place].  Schedule your colonoscopy with either:  [facility]   Address: [address], [place], [place] [address]  [phone]    or  [facility]  Address: [address], [place], [place] [address]  [phone]        You may obtain a copy of any labs that were done today by logging onto MyChart as outlined in the instructions provided with your AVS (after visit summary). The office will not call with normal lab results but certainly if there are any significant abnormalities then we will contact you.   Health Maintenance Adopting a healthy lifestyle and getting preventive care can go a long way to promote health and wellness. Talk with your health care provider about what schedule of regular examinations is right for you. This is a good chance for you to check in with your provider about disease prevention and staying healthy. In between checkups, there are plenty of things you can do on your own. Experts have done a lot of research about which lifestyle changes and preventive measures are most likely to keep you healthy. Ask your health care provider for more information. WEIGHT AND DIET  Eat a healthy diet  Be sure to include plenty of vegetables, fruits, low-fat dairy products, and lean  protein.  Do not eat a lot of foods high in solid fats, added sugars, or salt.  Get regular exercise. This is one of the most important things you can do for your health. 1. Most adults should exercise for at least 150 minutes each week. The exercise should increase your heart rate and make you sweat (moderate-intensity exercise). 2. Most adults should also do strengthening exercises at least twice a week. This is in addition to the moderate-intensity exercise.  Maintain a healthy weight  Body mass index (BMI) is a measurement that can be used to identify  possible weight problems. It estimates body fat based on height and weight. Your health care provider can help determine your BMI and help you achieve or maintain a healthy weight.  For females 8 years of age and older:  1. A BMI below 18.5 is considered underweight. 2. A BMI of 18.5 to 24.9 is normal. 3. A BMI of 25 to 29.9 is considered overweight. 4. A BMI of 30 and above is considered obese.  Watch levels of cholesterol and blood lipids  You should start having your blood tested for lipids and cholesterol at 52 years of age, then have this test every 5 years.  You may need to have your cholesterol levels checked more often if: 1. Your lipid or cholesterol levels are high. 2. You are older than 52 years of age. 3. You are at high risk for heart disease.  CANCER SCREENING   Lung Cancer  Lung cancer screening is recommended for adults 2-20 years old who are at high risk for lung cancer because of a history of smoking.  A yearly low-dose CT scan of the lungs is recommended for people who: 1. Currently smoke. 2. Have quit within the past 15 years. 3. Have at least a 30-pack-year history of smoking. A pack year is smoking an average of one pack of cigarettes a day for 1 year.  Yearly screening should continue until it has been 15 years since you quit.  Yearly screening should stop if you develop a health problem that would prevent you from having lung cancer treatment.  Breast Cancer  Practice breast self-awareness. This means understanding how your breasts normally appear and feel.  It also means doing regular breast self-exams. Let your health care provider know about any changes, no matter how small.  If you are in your 20s or 30s, you should have a clinical breast exam (CBE) by a health care provider every 1-3 years as part of a regular health exam.  If you are 26 or older, have a CBE every year. Also consider having a breast X-ray (mammogram) every year.  If you have  a family history of breast cancer, talk to your health care provider about genetic screening.  If you are at high risk for breast cancer, talk to your health care provider about having an MRI and a mammogram every year.  Breast cancer gene (BRCA) assessment is recommended for women who have family members with BRCA-related cancers. BRCA-related cancers include:  Breast.  Ovarian.  Tubal.  Peritoneal cancers.  Results of the assessment will determine the need for genetic counseling and BRCA1 and BRCA2 testing. Cervical Cancer Routine pelvic examinations to screen for cervical cancer are no longer recommended for nonpregnant women who are considered low risk for cancer of the pelvic organs (ovaries, uterus, and vagina) and who do not have symptoms. A pelvic examination may be necessary if you have symptoms including those associated with  pelvic infections. Ask your health care provider if a screening pelvic exam is right for you.   The Pap test is the screening test for cervical cancer for women who are considered at risk.  If you had a hysterectomy for a problem that was not cancer or a condition that could lead to cancer, then you no longer need Pap tests.  If you are older than 65 years, and you have had normal Pap tests for the past 10 years, you no longer need to have Pap tests.  If you have had past treatment for cervical cancer or a condition that could lead to cancer, you need Pap tests and screening for cancer for at least 20 years after your treatment.  If you no longer get a Pap test, assess your risk factors if they change (such as having a new sexual partner). This can affect whether you should start being screened again.  Some women have medical problems that increase their chance of getting cervical cancer. If this is the case for you, your health care provider may recommend more frequent screening and Pap tests.  The human papillomavirus (HPV) test is another test that may  be used for cervical cancer screening. The HPV test looks for the virus that can cause cell changes in the cervix. The cells collected during the Pap test can be tested for HPV.  The HPV test can be used to screen women 1 years of age and older. Getting tested for HPV can extend the interval between normal Pap tests from three to five years.  An HPV test also should be used to screen women of any age who have unclear Pap test results.  After 52 years of age, women should have HPV testing as often as Pap tests.  Colorectal Cancer  This type of cancer can be detected and often prevented.  Routine colorectal cancer screening usually begins at 52 years of age and continues through 52 years of age.  Your health care provider may recommend screening at an earlier age if you have risk factors for colon cancer.  Your health care provider may also recommend using home test kits to check for hidden blood in the stool.  A small camera at the end of a tube can be used to examine your colon directly (sigmoidoscopy or colonoscopy). This is done to check for the earliest forms of colorectal cancer.  Routine screening usually begins at age 35.  Direct examination of the colon should be repeated every 5-10 years through 52 years of age. However, you may need to be screened more often if early forms of precancerous polyps or small growths are found. Skin Cancer  Check your skin from head to toe regularly.  Tell your health care provider about any new moles or changes in moles, especially if there is a change in a mole's shape or color.  Also tell your health care provider if you have a mole that is larger than the size of a pencil eraser.  Always use sunscreen. Apply sunscreen liberally and repeatedly throughout the day.  Protect yourself by wearing long sleeves, pants, a wide-brimmed hat, and sunglasses whenever you are outside. HEART DISEASE, DIABETES, AND HIGH BLOOD PRESSURE   Have your blood  pressure checked at least every 1-2 years. High blood pressure causes heart disease and increases the risk of stroke.  If you are between 61 years and 11 years old, ask your health care provider if you should take aspirin to prevent strokes.  Have regular diabetes screenings. This involves taking a blood sample to check your fasting blood sugar level.  If you are at a normal weight and have a low risk for diabetes, have this test once every three years after 52 years of age.  If you are overweight and have a high risk for diabetes, consider being tested at a younger age or more often. PREVENTING INFECTION  Hepatitis B  If you have a higher risk for hepatitis B, you should be screened for this virus. You are considered at high risk for hepatitis B if:  You were born in a country where hepatitis B is common. Ask your health care provider which countries are considered high risk.  Your parents were born in a high-risk country, and you have not been immunized against hepatitis B (hepatitis B vaccine).  You have HIV or AIDS.  You use needles to inject street drugs.  You live with someone who has hepatitis B.  You have had sex with someone who has hepatitis B.  You get hemodialysis treatment.  You take certain medicines for conditions, including cancer, organ transplantation, and autoimmune conditions. Hepatitis C  Blood testing is recommended for:  Everyone born from [birth year] through 1963.  Anyone with known risk factors for hepatitis C. Sexually transmitted infections (STIs)  You should be screened for sexually transmitted infections (STIs) including gonorrhea and chlamydia if:  You are sexually active and are younger than 52 years of age.  You are older than 52 years of age and your health care provider tells you that you are at risk for this type of infection.  Your sexual activity has changed since you were last screened and you are at an increased risk for chlamydia or  gonorrhea. Ask your health care provider if you are at risk.  If you do not have HIV, but are at risk, it may be recommended that you take a prescription medicine daily to prevent HIV infection. This is called pre-exposure prophylaxis (PrEP). You are considered at risk if:  You are sexually active and do not regularly use condoms or know the HIV status of your partner(s).  You take drugs by injection.  You are sexually active with a partner who has HIV. Talk with your health care provider about whether you are at high risk of being infected with HIV. If you choose to begin PrEP, you should first be tested for HIV. You should then be tested every 3 months for as long as you are taking PrEP.  PREGNANCY   If you are premenopausal and you may become pregnant, ask your health care provider about preconception counseling.  If you may become pregnant, take 400 to 800 micrograms (mcg) of folic acid every day.  If you want to prevent pregnancy, talk to your health care provider about birth control (contraception). OSTEOPOROSIS AND MENOPAUSE   Osteoporosis is a disease in which the bones lose minerals and strength with aging. This can result in serious bone fractures. Your risk for osteoporosis can be identified using a bone density scan.  If you are 41 years of age or older, or if you are at risk for osteoporosis and fractures, ask your health care provider if you should be screened.  Ask your health care provider whether you should take a calcium or vitamin D supplement to lower your risk for osteoporosis.  Menopause may have certain physical symptoms and risks.  Hormone replacement therapy may reduce some of these symptoms and risks. Talk to your  health care provider about whether hormone replacement therapy is right for you.  HOME CARE INSTRUCTIONS   Schedule regular health, dental, and eye exams.  Stay current with your immunizations.   Do not use any tobacco products including  cigarettes, chewing tobacco, or electronic cigarettes.  If you are pregnant, do not drink alcohol.  If you are breastfeeding, limit how much and how often you drink alcohol.  Limit alcohol intake to no more than 1 drink per day for nonpregnant women. One drink equals 12 ounces of beer, 5 ounces of wine, or 1 ounces of hard liquor.  Do not use street drugs.  Do not share needles.  Ask your health care provider for help if you need support or information about quitting drugs.  Tell your health care provider if you often feel depressed.  Tell your health care provider if you have ever been abused or do not feel safe at home. Document Released: 09/28/2010 Document Revised: 07/30/2013 Document Reviewed: 02/14/2013 [facility] Patient Information 2015 [place], [place]. This information is not intended to replace advice given to you by your health care provider. Make sure you discuss any questions you have with your health care provider.

## 2014-12-20 NOTE — Progress Notes (Signed)
Sabrina Donaldson Apr 12, 1960 [id number]        54 y.o.  [id number] for annual exam.  Several issues noted below  Past medical history,surgical history, problem list, medications, allergies, family history and social history were all reviewed and documented as reviewed in the EPIC chart.  ROS:  Performed with pertinent positives and negatives included in the history, assessment and plan.   Additional significant findings :  none   Exam: [facility] person Vitals:   12/20/14 0856  BP: 124/76  Height:  (1.753 m)  Weight: 271 lb (122.925 kg)   General appearance:  Normal affect, orientation and appearance. Skin: Grossly normal HEENT: Without gross lesions.  No cervical or supraclavicular adenopathy. Thyroid normal.  Lungs:  Clear without wheezing, rales or rhonchi Cardiac: RR, without RMG Abdominal:  Soft, nontender, without masses, guarding, rebound, organomegaly or hernia Breasts:  Examined lying and sitting without masses, retractions, discharge or axillary adenopathy. Pelvic:  Ext/BUS/vagina normal  Cervix normal  Uterus difficult to palpate but grossly normal in size midline mobile nontender   Adnexa  Without gross masses or tenderness    Anus and perineum  Normal   Rectovaginal  Normal sphincter tone without palpated masses or tenderness.  Extremities with pitting edema mid calf bilaterally   Assessment/Plan:  54 y.o. [id number] female for annual exam with regular menses, tubal sterilization.   1. Regular menses. Occasional skips but overall monthly. No prolonged or atypical bleeding. Minimal hot flushes. We'll continue to monitor. Will report prolonged or atypical bleeding. 2. Peripheral pitting edema. Recommended primary physician follow up and evaluation. Differential reviewed with her to include renal/cardiac/vascular. Her weight continuing to this was also discussed and weight loss regimens recommended. 3. Mammography 06/2013. Need to schedule mammogram now reviewed. SBE  monthly reviewed. 4. Pap smear 2015 normal. No Pap smear done today. Plan repeat Pap smear at 3 year interval. History of cone biopsy 1997 with normal Pap smears since then 5. Colonoscopy never. Stressed the need to schedule a screening colonoscopy. 6. Hypothyroidism. Check TSH today. Synthroid 125 g refill 1 year provided. 7. Anxiety. On BuSpar 10 mg. Doing well wants to continue. Refill 1 year provided. 8. Health maintenance. Baseline CBC comprehensive metabolic panel lipid profile urinalysis TSH vitamin D ordered. Follow up for GYN checkup in 1 year.   Dara Lords MD, 9:28 AM 12/20/2014

## 2014-12-21 LAB — URINALYSIS W MICROSCOPIC + REFLEX CULTURE
BILIRUBIN URINE: NEGATIVE
Bacteria, UA: NONE SEEN [HPF]
Casts: NONE SEEN [LPF]
Crystals: NONE SEEN [HPF]
GLUCOSE, UA: NEGATIVE
Ketones, ur: NEGATIVE
Leukocytes, UA: NEGATIVE
NITRITE: NEGATIVE
PH: 5 (ref 5.0–8.0)
PROTEIN: NEGATIVE
SPECIFIC GRAVITY, URINE: 1.013 (ref 1.001–1.035)
WBC, UA: NONE SEEN WBC/HPF (ref <=5)
YEAST: NONE SEEN [HPF]

## 2014-12-21 LAB — VITAMIN D 25 HYDROXY (VIT D DEFICIENCY, FRACTURES): Vit D, 25-Hydroxy: 24 ng/mL — ABNORMAL LOW (ref 30–100)

## 2014-12-22 LAB — URINE CULTURE: Colony Count: 8000

## 2014-12-25 ENCOUNTER — Other Ambulatory Visit: Payer: Self-pay | Admitting: Gynecology

## 2014-12-25 DIAGNOSIS — R7989 Other specified abnormal findings of blood chemistry: Secondary | ICD-10-CM

## 2014-12-25 DIAGNOSIS — E559 Vitamin D deficiency, unspecified: Secondary | ICD-10-CM

## 2014-12-25 MED ORDER — LEVOTHYROXINE SODIUM 112 MCG PO TABS: 112.0000 ug | ORAL_TABLET | Freq: Every day | ORAL | Status: DC

## 2015-04-14 ENCOUNTER — Other Ambulatory Visit: Payer: 59

## 2015-04-14 DIAGNOSIS — E559 Vitamin D deficiency, unspecified: Secondary | ICD-10-CM

## 2015-04-14 DIAGNOSIS — R7989 Other specified abnormal findings of blood chemistry: Secondary | ICD-10-CM

## 2015-04-14 LAB — TSH: TSH: 0.116 u[IU]/mL — ABNORMAL LOW (ref 0.350–4.500)

## 2015-04-15 ENCOUNTER — Other Ambulatory Visit: Payer: Self-pay | Admitting: Gynecology

## 2015-04-15 DIAGNOSIS — E559 Vitamin D deficiency, unspecified: Secondary | ICD-10-CM

## 2015-04-15 DIAGNOSIS — R7989 Other specified abnormal findings of blood chemistry: Secondary | ICD-10-CM

## 2015-04-15 LAB — VITAMIN D 25 HYDROXY (VIT D DEFICIENCY, FRACTURES): Vit D, 25-Hydroxy: 26 ng/mL — ABNORMAL LOW (ref 30–100)

## 2015-04-15 MED ORDER — VITAMIN D (ERGOCALCIFEROL) 1.25 MG (50000 UNIT) PO CAPS: 50000.0000 [IU] | ORAL_CAPSULE | ORAL | Status: DC

## 2015-04-15 MED ORDER — LEVOTHYROXINE SODIUM 100 MCG PO TABS: 100.0000 ug | ORAL_TABLET | Freq: Every day | ORAL | Status: DC

## 2015-07-04 ENCOUNTER — Other Ambulatory Visit: Payer: 59

## 2015-07-09 ENCOUNTER — Other Ambulatory Visit: Payer: 59

## 2015-07-09 DIAGNOSIS — R7989 Other specified abnormal findings of blood chemistry: Secondary | ICD-10-CM

## 2015-07-09 DIAGNOSIS — E559 Vitamin D deficiency, unspecified: Secondary | ICD-10-CM

## 2015-07-10 ENCOUNTER — Other Ambulatory Visit: Payer: Self-pay | Admitting: Gynecology

## 2015-07-10 LAB — TSH: TSH: 3.45 mIU/L

## 2015-07-10 LAB — VITAMIN D 25 HYDROXY (VIT D DEFICIENCY, FRACTURES): Vit D, 25-Hydroxy: 34 ng/mL (ref 30–100)

## 2015-07-10 MED ORDER — LEVOTHYROXINE SODIUM 100 MCG PO TABS: 100.0000 ug | ORAL_TABLET | Freq: Every day | ORAL | Status: AC

## 2015-08-01 ENCOUNTER — Other Ambulatory Visit: Payer: Self-pay | Admitting: Internal Medicine

## 2015-08-01 ENCOUNTER — Encounter: Payer: Self-pay | Admitting: Gastroenterology

## 2015-08-01 DIAGNOSIS — Z1231 Encounter for screening mammogram for malignant neoplasm of breast: Secondary | ICD-10-CM

## 2015-09-09 ENCOUNTER — Encounter: Payer: Self-pay | Admitting: Gastroenterology

## 2015-09-29 ENCOUNTER — Encounter: Payer: Self-pay | Admitting: Gastroenterology

## 2015-10-13 ENCOUNTER — Ambulatory Visit
Admission: RE | Admit: 2015-10-13 | Discharge: 2015-10-13 | Disposition: A | Discharge status: Home / Self Care | Payer: 59 | Source: Ambulatory Visit | Attending: Internal Medicine | Admitting: Internal Medicine

## 2015-10-13 DIAGNOSIS — Z1231 Encounter for screening mammogram for malignant neoplasm of breast: Secondary | ICD-10-CM

## 2015-10-23 ENCOUNTER — Ambulatory Visit (INDEPENDENT_AMBULATORY_CARE_PROVIDER_SITE_OTHER): Payer: 59 | Admitting: Gynecology

## 2015-10-23 ENCOUNTER — Telehealth: Payer: Self-pay | Admitting: Gynecology

## 2015-10-23 ENCOUNTER — Encounter: Payer: Self-pay | Admitting: Gynecology

## 2015-10-23 VITALS — BP 140/90

## 2015-10-23 DIAGNOSIS — B373 Candidiasis of vulva and vagina: Secondary | ICD-10-CM

## 2015-10-23 DIAGNOSIS — B3731 Acute candidiasis of vulva and vagina: Secondary | ICD-10-CM

## 2015-10-23 DIAGNOSIS — R21 Rash and other nonspecific skin eruption: Secondary | ICD-10-CM | POA: Diagnosis not present

## 2015-10-23 LAB — WET PREP FOR TRICH, YEAST, CLUE
Clue Cells Wet Prep HPF POC: NONE SEEN
TRICH WET PREP: NONE SEEN

## 2015-10-23 MED ORDER — NYSTATIN 100000 UNIT/GM EX CREA: TOPICAL_CREAM | Freq: Two times a day (BID) | CUTANEOUS | Status: DC

## 2015-10-23 MED ORDER — FLUCONAZOLE 150 MG PO TABS: 150.0000 mg | ORAL_TABLET | Freq: Every day | ORAL | 0 refills | Status: DC

## 2015-10-23 MED ORDER — TRIAMCINOLONE ACETONIDE 0.5 % EX CREA: TOPICAL_CREAM | Freq: Two times a day (BID) | CUTANEOUS | Status: DC

## 2015-10-23 NOTE — Addendum Note (Signed)
Addended by: Dayna Barker on: 10/23/2015 03:27 PM   Modules accepted: Orders

## 2015-10-23 NOTE — Telephone Encounter (Signed)
Tell patient I forgot to mention at her office visit her blood pressure is 140/90. She should follow this and if remains elevated she needs to follow up with her primary physician.

## 2015-10-23 NOTE — Patient Instructions (Signed)
Take the Diflucan pill daily for 5 days. Mix the 2 prescribed creams together in equal amounts and applied to the rash as needed. Can do this twice a day

## 2015-10-23 NOTE — Telephone Encounter (Signed)
Left message for pt to call.

## 2015-10-23 NOTE — Progress Notes (Signed)
    Sabrina Donaldson 24-May-1960 [id number]        55 y.o.  [id number] presents with 2 complaints. She's noticed a rash in the crease of her groin bilaterally over the last several weeks. Comes and goes. Has tried powders and some topical creams with some success. Also notes now the last several days vaginal itching. No discharge odor. No urinary symptoms such as frequency dysuria or urgency low back pain fever or chills.  Past medical history,surgical history, problem list, medications, allergies, family history and social history were all reviewed and documented in the EPIC chart.  Directed ROS with pertinent positives and negatives documented in the history of present illness/assessment and plan.  Exam: [name] assistant Vitals:   10/23/15 1428  BP: 140/90   General appearance:  Normal Abdomen soft nontender without masses guarding rebound Classic fungal rash in the crease of both groins. Pelvic external BUS vagina with white discharge. Cervix normal. Uterus grossly normal size midline mobile nontender. Adnexa without masses or tenderness.  Assessment/Plan:  55 y.o. [id number] with fungal skin rash in her groin and yeast vaginitis with positive wet prep. Given the skin involvement we'll treat with Diflucan 150 mg daily 5 days and Mycostatin cream twice a day mixed with triamcinolone 0.5% cream as needed for the skin rash. Patient will follow up if her symptoms persist, worsen or recur.  Office will call patient with her blood pressure that I forgot to mention is mildly elevated at her office visit. She'll follow up with her primary if this continues.    Dara Lords MD, 2:49 PM 10/23/2015

## 2015-10-24 MED ORDER — NYSTATIN 100000 UNIT/GM EX CREA: TOPICAL_CREAM | Freq: Two times a day (BID) | CUTANEOUS | 0 refills | Status: DC

## 2015-10-24 MED ORDER — TRIAMCINOLONE ACETONIDE 0.5 % EX CREA: 1.0000 "application " | TOPICAL_CREAM | Freq: Two times a day (BID) | CUTANEOUS | 0 refills | Status: DC

## 2015-10-24 NOTE — Telephone Encounter (Signed)
Pt informed with the below note, said her PCP is following her blood pressure. Pt asked what do you recommend taking OTC for hot flashes? Please advise

## 2015-10-24 NOTE — Telephone Encounter (Signed)
She can try Estrovin over-the-counter

## 2015-10-24 NOTE — Telephone Encounter (Signed)
Left the below on pt voicemail. 

## 2015-11-24 ENCOUNTER — Ambulatory Visit (AMBULATORY_SURGERY_CENTER): Payer: Self-pay

## 2015-11-24 ENCOUNTER — Encounter: Payer: Self-pay | Admitting: Gastroenterology

## 2015-11-24 VITALS — Ht 69.5 in | Wt 271.6 lb

## 2015-11-24 DIAGNOSIS — Z1211 Encounter for screening for malignant neoplasm of colon: Secondary | ICD-10-CM

## 2015-11-24 MED ORDER — NA SULFATE-K SULFATE-MG SULF 17.5-3.13-1.6 GM/177ML PO SOLN: ORAL | 0 refills | Status: DC

## 2015-11-24 NOTE — Progress Notes (Signed)
Per pt, no allergies to soy or egg products.Pt not taking any weight loss meds or using  O2 at home. 

## 2015-12-08 ENCOUNTER — Encounter: Payer: Self-pay | Admitting: Gastroenterology

## 2015-12-08 ENCOUNTER — Ambulatory Visit (AMBULATORY_SURGERY_CENTER): Payer: 59 | Admitting: Gastroenterology

## 2015-12-08 VITALS — BP 127/85 | HR 68 | Temp 98.4°F | Resp 16 | Ht 69.5 in | Wt 271.0 lb

## 2015-12-08 DIAGNOSIS — Z1211 Encounter for screening for malignant neoplasm of colon: Secondary | ICD-10-CM

## 2015-12-08 HISTORY — PX: COLONOSCOPY: SHX174

## 2015-12-08 MED ORDER — SODIUM CHLORIDE 0.9 % IV SOLN: 500.0000 mL | INTRAVENOUS | Status: DC

## 2015-12-08 NOTE — Progress Notes (Signed)
Report to PACU, RN, vss, BBS= Clear.  

## 2015-12-08 NOTE — Patient Instructions (Signed)
YOU HAD AN ENDOSCOPIC PROCEDURE TODAY AT THE [facility]:   Refer to the procedure report that was given to you for any specific questions about what was found during the examination.  If the procedure report does not answer your questions, please call your gastroenterologist to clarify.  If you requested that your care partner not be given the details of your procedure findings, then the procedure report has been included in a sealed envelope for you to review at your convenience later.  YOU SHOULD EXPECT: Some feelings of bloating in the abdomen. Passage of more gas than usual.  Walking can help get rid of the air that was put into your GI tract during the procedure and reduce the bloating. If you had a lower endoscopy (such as a colonoscopy or flexible sigmoidoscopy) you may notice spotting of blood in your stool or on the toilet paper. If you underwent a bowel prep for your procedure, you may not have a normal bowel movement for a few days.  Please Note:  You might notice some irritation and congestion in your nose or some drainage.  This is from the oxygen used during your procedure.  There is no need for concern and it should clear up in a day or so.  SYMPTOMS TO REPORT IMMEDIATELY:   Following lower endoscopy (colonoscopy or flexible sigmoidoscopy):  Excessive amounts of blood in the stool  Significant tenderness or worsening of abdominal pains  Swelling of the abdomen that is new, acute  Fever of 100F or higher  For urgent or emergent issues, a gastroenterologist can be reached at any hour by calling [phone].   DIET:  We do recommend a small meal at first, but then you may proceed to your regular diet.  Drink plenty of fluids but you should avoid alcoholic beverages for 24 hours.  ACTIVITY:  You should plan to take it easy for the rest of today and you should NOT DRIVE or use heavy machinery until tomorrow (because of the sedation medicines used during the test).     FOLLOW UP: Our staff will call the number listed on your records the next business day following your procedure to check on you and address any questions or concerns that you may have regarding the information given to you following your procedure. If we do not reach you, we will leave a message.  However, if you are feeling well and you are not experiencing any problems, there is no need to return our call.  We will assume that you have returned to your regular daily activities without incident.  SIGNATURES/CONFIDENTIALITY: You and/or your care partner have signed paperwork which will be entered into your electronic medical record.  These signatures attest to the fact that that the information above on your After Visit Summary has been reviewed and is understood.  Full responsibility of the confidentiality of this discharge information lies with you and/or your care-partner.  Please continue your normal medications  Next colonoscopy-10 years

## 2015-12-08 NOTE — Op Note (Signed)
[facility] Patient Name: Sabrina Donaldson Procedure Date: 12/08/2015 10:15 AM MRN: [id number] Endoscopist: Sherilyn CooterHenry L. Myrtie Neitheranis , MD Age: [age] Referring MD:  Date of Birth: [birth year] Gender: Female Account #: [id number] Procedure:                Colonoscopy Indications:              Screening for colorectal malignant neoplasm, This                            is the patient's first colonoscopy Medicines:                Monitored Anesthesia Care Procedure:                Pre-Anesthesia Assessment:                           - Prior to the procedure, a History and Physical                            was performed, and patient medications and                            allergies were reviewed. The patient's tolerance of                            previous anesthesia was also reviewed. The risks                            and benefits of the procedure and the sedation                            options and risks were discussed with the patient.                            All questions were answered, and informed consent                            was obtained. Prior Anticoagulants: The patient has                            taken no previous anticoagulant or antiplatelet                            agents. ASA Grade Assessment: II - A patient with                            mild systemic disease. After reviewing the risks                            and benefits, the patient was deemed in                            satisfactory condition to undergo the procedure.  After obtaining informed consent, the colonoscope                            was passed under direct vision. Throughout the                            procedure, the patient's blood pressure, pulse, and                            oxygen saturations were monitored continuously. The                            Model CF-HQ190L [id number]) scope was introduced                            through the anus and  advanced to the the cecum,                            identified by appendiceal orifice and ileocecal                            valve. The colonoscopy was performed without                            difficulty. The patient tolerated the procedure                            well. The quality of the bowel preparation was                            evaluated using the BBPS [facility] Bowel Preparation                            Scale) with scores of: Right Colon = 3, Transverse                            Colon = 3 and Left Colon = 3 (entire mucosa seen                            well with no residual staining, small fragments of                            stool or opaque liquid). The total BBPS score                            equals 9. The bowel preparation used was SUPREP.                            The ileocecal valve, appendiceal orifice, and                            rectum were photographed. Scope In: 10:25:29 AM Scope Out: 10:35:57 AM Scope Withdrawal Time: 0 hours  6 minutes 54 seconds  Total Procedure Duration: 0 hours 10 minutes 28 seconds  Findings:                 The perianal and digital rectal examinations were                            normal.                           The entire examined colon appeared normal on direct                            and retroflexion views.                           Retroflexion in the rectum was not performed due to                            anatomy. Complications:            No immediate complications. Estimated Blood Loss:     Estimated blood loss: none. Impression:               - The entire examined colon is normal on direct and                            retroflexion views.                           - No specimens collected. Recommendation:           - Patient has a contact number available for                            emergencies. The signs and symptoms of potential                            delayed complications were discussed with  the                            patient. Return to normal activities tomorrow.                            Written discharge instructions were provided to the                            patient.                           - Resume previous diet.                           - Continue present medications.                           - Repeat colonoscopy in 10 years for screening  purposes. Henry L. Myrtie Neither, MD 12/08/2015 10:39:20 AM This report has been signed electronically.

## 2015-12-09 ENCOUNTER — Telehealth: Payer: Self-pay | Admitting: *Deleted

## 2015-12-09 NOTE — Telephone Encounter (Signed)
  Follow up Call-  Call back number 12/08/2015  Post procedure Call Back phone  # #[phone] cell  Permission to leave phone message Yes  Some recent data might be hidden     Patient questions:  Do you have a fever, pain , or abdominal swelling? No. Pain Score  0 *  Have you tolerated food without any problems? Yes.    Have you been able to return to your normal activities? Yes.    Do you have any questions about your discharge instructions: Diet   No. Medications  No. Follow up visit  No.  Do you have questions or concerns about your Care? No.  Actions: * If pain score is 4 or above: No action needed, pain <4.

## 2015-12-22 ENCOUNTER — Encounter: Payer: 59 | Admitting: Gynecology

## 2015-12-25 ENCOUNTER — Encounter: Payer: Self-pay | Admitting: Gynecology

## 2015-12-25 ENCOUNTER — Ambulatory Visit (INDEPENDENT_AMBULATORY_CARE_PROVIDER_SITE_OTHER): Payer: 59 | Admitting: Gynecology

## 2015-12-25 VITALS — BP 122/78 | Ht 69.0 in | Wt 271.0 lb

## 2015-12-25 DIAGNOSIS — F411 Generalized anxiety disorder: Secondary | ICD-10-CM | POA: Diagnosis not present

## 2015-12-25 DIAGNOSIS — E559 Vitamin D deficiency, unspecified: Secondary | ICD-10-CM | POA: Diagnosis not present

## 2015-12-25 DIAGNOSIS — N926 Irregular menstruation, unspecified: Secondary | ICD-10-CM | POA: Diagnosis not present

## 2015-12-25 DIAGNOSIS — N951 Menopausal and female climacteric states: Secondary | ICD-10-CM

## 2015-12-25 DIAGNOSIS — Z01419 Encounter for gynecological examination (general) (routine) without abnormal findings: Secondary | ICD-10-CM

## 2015-12-25 MED ORDER — BUSPIRONE HCL 15 MG PO TABS: 15.0000 mg | ORAL_TABLET | Freq: Every day | ORAL | 12 refills | Status: DC

## 2015-12-25 NOTE — Patient Instructions (Signed)
Try the higher dose of BuSpar. Call me if you have any issues with this.  You may obtain a copy of any labs that were done today by logging onto MyChart as outlined in the instructions provided with your AVS (after visit summary). The office will not call with normal lab results but certainly if there are any significant abnormalities then we will contact you.   Health Maintenance Adopting a healthy lifestyle and getting preventive care can go a long way to promote health and wellness. Talk with your health care provider about what schedule of regular examinations is right for you. This is a good chance for you to check in with your provider about disease prevention and staying healthy. In between checkups, there are plenty of things you can do on your own. Experts have done a lot of research about which lifestyle changes and preventive measures are most likely to keep you healthy. Ask your health care provider for more information. WEIGHT AND DIET  Eat a healthy diet  Be sure to include plenty of vegetables, fruits, low-fat dairy products, and lean protein.  Do not eat a lot of foods high in solid fats, added sugars, or salt.  Get regular exercise. This is one of the most important things you can do for your health.  Most adults should exercise for at least 150 minutes each week. The exercise should increase your heart rate and make you sweat (moderate-intensity exercise).  Most adults should also do strengthening exercises at least twice a week. This is in addition to the moderate-intensity exercise.  Maintain a healthy weight  Body mass index (BMI) is a measurement that can be used to identify possible weight problems. It estimates body fat based on height and weight. Your health care provider can help determine your BMI and help you achieve or maintain a healthy weight.  For females 29 years of age and older:   A BMI below 18.5 is considered underweight.  A BMI of 18.5 to 24.9 is  normal.  A BMI of 25 to 29.9 is considered overweight.  A BMI of 30 and above is considered obese.  Watch levels of cholesterol and blood lipids  You should start having your blood tested for lipids and cholesterol at 53 years of age, then have this test every 5 years.  You may need to have your cholesterol levels checked more often if:  Your lipid or cholesterol levels are high.  You are older than 53 years of age.  You are at high risk for heart disease.  CANCER SCREENING   Lung Cancer  Lung cancer screening is recommended for adults 66-45 years old who are at high risk for lung cancer because of a history of smoking.  A yearly low-dose CT scan of the lungs is recommended for people who:  Currently smoke.  Have quit within the past 15 years.  Have at least a 30-pack-year history of smoking. A pack year is smoking an average of one pack of cigarettes a day for 1 year.  Yearly screening should continue until it has been 15 years since you quit.  Yearly screening should stop if you develop a health problem that would prevent you from having lung cancer treatment.  Breast Cancer  Practice breast self-awareness. This means understanding how your breasts normally appear and feel.  It also means doing regular breast self-exams. Let your health care provider know about any changes, no matter how small.  If you are in your 40s or  41s, you should have a clinical breast exam (CBE) by a health care provider every 1-3 years as part of a regular health exam.  If you are 77 or older, have a CBE every year. Also consider having a breast X-ray (mammogram) every year.  If you have a family history of breast cancer, talk to your health care provider about genetic screening.  If you are at high risk for breast cancer, talk to your health care provider about having an MRI and a mammogram every year.  Breast cancer gene (BRCA) assessment is recommended for women who have family members  with BRCA-related cancers. BRCA-related cancers include:  Breast.  Ovarian.  Tubal.  Peritoneal cancers.  Results of the assessment will determine the need for genetic counseling and BRCA1 and BRCA2 testing. Cervical Cancer Routine pelvic examinations to screen for cervical cancer are no longer recommended for nonpregnant women who are considered low risk for cancer of the pelvic organs (ovaries, uterus, and vagina) and who do not have symptoms. A pelvic examination may be necessary if you have symptoms including those associated with pelvic infections. Ask your health care provider if a screening pelvic exam is right for you.   The Pap test is the screening test for cervical cancer for women who are considered at risk.  If you had a hysterectomy for a problem that was not cancer or a condition that could lead to cancer, then you no longer need Pap tests.  If you are older than 65 years, and you have had normal Pap tests for the past 10 years, you no longer need to have Pap tests.  If you have had past treatment for cervical cancer or a condition that could lead to cancer, you need Pap tests and screening for cancer for at least 20 years after your treatment.  If you no longer get a Pap test, assess your risk factors if they change (such as having a new sexual partner). This can affect whether you should start being screened again.  Some women have medical problems that increase their chance of getting cervical cancer. If this is the case for you, your health care provider may recommend more frequent screening and Pap tests.  The human papillomavirus (HPV) test is another test that may be used for cervical cancer screening. The HPV test looks for the virus that can cause cell changes in the cervix. The cells collected during the Pap test can be tested for HPV.  The HPV test can be used to screen women 78 years of age and older. Getting tested for HPV can extend the interval between normal  Pap tests from three to five years.  An HPV test also should be used to screen women of any age who have unclear Pap test results.  After 53 years of age, women should have HPV testing as often as Pap tests.  Colorectal Cancer  This type of cancer can be detected and often prevented.  Routine colorectal cancer screening usually begins at 53 years of age and continues through 53 years of age.  Your health care provider may recommend screening at an earlier age if you have risk factors for colon cancer.  Your health care provider may also recommend using home test kits to check for hidden blood in the stool.  A small camera at the end of a tube can be used to examine your colon directly (sigmoidoscopy or colonoscopy). This is done to check for the earliest forms of colorectal cancer.  Routine screening usually begins at age 50.  Direct examination of the colon should be repeated every 5-10 years through 53 years of age. However, you may need to be screened more often if early forms of precancerous polyps or small growths are found. Skin Cancer  Check your skin from head to toe regularly.  Tell your health care provider about any new moles or changes in moles, especially if there is a change in a mole's shape or color.  Also tell your health care provider if you have a mole that is larger than the size of a pencil eraser.  Always use sunscreen. Apply sunscreen liberally and repeatedly throughout the day.  Protect yourself by wearing long sleeves, pants, a wide-brimmed hat, and sunglasses whenever you are outside. HEART DISEASE, DIABETES, AND HIGH BLOOD PRESSURE   Have your blood pressure checked at least every 1-2 years. High blood pressure causes heart disease and increases the risk of stroke.  If you are between 55 years and 79 years old, ask your health care provider if you should take aspirin to prevent strokes.  Have regular diabetes screenings. This involves taking a blood  sample to check your fasting blood sugar level.  If you are at a normal weight and have a low risk for diabetes, have this test once every three years after 53 years of age.  If you are overweight and have a high risk for diabetes, consider being tested at a younger age or more often. PREVENTING INFECTION  Hepatitis B  If you have a higher risk for hepatitis B, you should be screened for this virus. You are considered at high risk for hepatitis B if:  You were born in a country where hepatitis B is common. Ask your health care provider which countries are considered high risk.  Your parents were born in a high-risk country, and you have not been immunized against hepatitis B (hepatitis B vaccine).  You have HIV or AIDS.  You use needles to inject street drugs.  You live with someone who has hepatitis B.  You have had sex with someone who has hepatitis B.  You get hemodialysis treatment.  You take certain medicines for conditions, including cancer, organ transplantation, and autoimmune conditions. Hepatitis C  Blood testing is recommended for:  Everyone born from 1945 through 1965.  Anyone with known risk factors for hepatitis C. Sexually transmitted infections (STIs)  You should be screened for sexually transmitted infections (STIs) including gonorrhea and chlamydia if:  You are sexually active and are younger than 53 years of age.  You are older than 53 years of age and your health care provider tells you that you are at risk for this type of infection.  Your sexual activity has changed since you were last screened and you are at an increased risk for chlamydia or gonorrhea. Ask your health care provider if you are at risk.  If you do not have HIV, but are at risk, it may be recommended that you take a prescription medicine daily to prevent HIV infection. This is called pre-exposure prophylaxis (PrEP). You are considered at risk if:  You are sexually active and do not  regularly use condoms or know the HIV status of your partner(s).  You take drugs by injection.  You are sexually active with a partner who has HIV. Talk with your health care provider about whether you are at high risk of being infected with HIV. If you choose to begin PrEP, you should first   be tested for HIV. You should then be tested every 3 months for as long as you are taking PrEP.  PREGNANCY   If you are premenopausal and you may become pregnant, ask your health care provider about preconception counseling.  If you may become pregnant, take 400 to 800 micrograms (mcg) of folic acid every day.  If you want to prevent pregnancy, talk to your health care provider about birth control (contraception). OSTEOPOROSIS AND MENOPAUSE   Osteoporosis is a disease in which the bones lose minerals and strength with aging. This can result in serious bone fractures. Your risk for osteoporosis can be identified using a bone density scan.  If you are 65 years of age or older, or if you are at risk for osteoporosis and fractures, ask your health care provider if you should be screened.  Ask your health care provider whether you should take a calcium or vitamin D supplement to lower your risk for osteoporosis.  Menopause may have certain physical symptoms and risks.  Hormone replacement therapy may reduce some of these symptoms and risks. Talk to your health care provider about whether hormone replacement therapy is right for you.  HOME CARE INSTRUCTIONS   Schedule regular health, dental, and eye exams.  Stay current with your immunizations.   Do not use any tobacco products including cigarettes, chewing tobacco, or electronic cigarettes.  If you are pregnant, do not drink alcohol.  If you are breastfeeding, limit how much and how often you drink alcohol.  Limit alcohol intake to no more than 1 drink per day for nonpregnant women. One drink equals 12 ounces of beer, 5 ounces of wine, or 1  ounces of hard liquor.  Do not use street drugs.  Do not share needles.  Ask your health care provider for help if you need support or information about quitting drugs.  Tell your health care provider if you often feel depressed.  Tell your health care provider if you have ever been abused or do not feel safe at home. Document Released: 09/28/2010 Document Revised: 07/30/2013 Document Reviewed: 02/14/2013 Exit[employer] Patient Information 2015 [employer]. This information is not intended to replace advice given to you by your health care provider. Make sure you discuss any questions you have with your health care provider.  

## 2015-12-25 NOTE — Progress Notes (Signed)
Sabrina Donaldson 1960-11-21 [id number]        55 y.o.  [id number]  for annual exam.  Several issues noted below.  Past medical history,surgical history, problem list, medications, allergies, family history and social history were all reviewed and documented as reviewed in the EPIC chart.  ROS:  Performed with pertinent positives and negatives included in the history, assessment and plan.   Additional significant findings :  None   Exam: [name] assistant Vitals:   12/25/15 0900  BP: 122/78  Weight: 271 lb (122.9 kg)  Height: 5\' 9"  (1.753 m)   Body mass index is 40.02 kg/m.  General appearance:  Normal affect, orientation and appearance. Skin: Grossly normal HEENT: Without gross lesions.  No cervical or supraclavicular adenopathy. Thyroid normal.  Lungs:  Clear without wheezing, rales or rhonchi Cardiac: RR, without RMG Abdominal:  Soft, nontender, without masses, guarding, rebound, organomegaly or hernia Breasts:  Examined lying and sitting without masses, retractions, discharge or axillary adenopathy. Pelvic:  Ext, BUS, Vagina normal  Cervix normal  Uterus anteverted, normal size, shape and contour, midline and mobile nontender   Adnexa without masses or tenderness    Anus and perineum normal   Rectovaginal normal sphincter tone without palpated masses or tenderness.    Assessment/Plan:  55 y.o. G9P2002 female for annual exam with irregular menses, tubal sterilization.   1. Irregular menses/menopausal symptoms. The past year patient is started skipping periods. She'll have several on a monthly basis and then skip a month. No prolonged or atypical bleeding. Is also starting to have hot flushes and sweats. Reviewed options with her to include OTC products up to including HRT. Risks/benefits reviewed. Patient not interested in HRT at this point. She's going to monitor her symptoms try OTC soy based products and see if this does not help. She'll call me if she is interested  in pursuing HRT. She will keep a menstrual calendar as long as less frequent but regular them will follow. If she has prolonged or atypical bleeding goes more than one year without bleeding and then bleeds she knows to call me. 2. Anxiety. Patient on BuSpar 10 mg daily. Notes increased anxiety due to situational stresses at home. Options for management were reviewed to include increasing the dose noting she is on it once daily which is a low dose, change in the medication or adding a when necessary medication such as Xanax. We will start by increasing her to 15 mg daily and see how she does with this. She'll call me if she has any issues with this. 3. History of vitamin D deficiency. Taking supplements. Relates check earlier this year Dr. Ferd Hibbs office still was low. Check vitamin D level today. 4. Peripheral edema. Noted last year. Saw Dr. Timothy Lasso who is managing this for her. Currently on a diuretic with improvement in her swelling. No evidence of swelling today on exam. 5. Mammography 09/2015. Continue with annual mammography when due. SBE monthly reviewed. 6. Colonoscopy 2017. Repeat at their recommended interval. 7. Pap smear 2015. No Pap smear done today. Plan repeat Pap smear next year at 3 year interval. History of cone biopsy 1997 with normal Pap smears afterwards. 8. Health maintenance. Dr. Timothy Lasso is following her hypothyroidism and routine labs. No additional labs drawn other than her vitamin D. Follow up in one year, sooner as needed.  Greater than 10 minutes of my time in excess of her routine gynecologic exam was spent in direct face to face counseling and coordination  of care in regards to her problems of irregular menses, menopausal symptoms and anxiety.    Dara LordsFONTAINE,TIMOTHY P MD, 9:26 AM 12/25/2015

## 2015-12-26 LAB — VITAMIN D 25 HYDROXY (VIT D DEFICIENCY, FRACTURES): Vit D, 25-Hydroxy: 30 ng/mL (ref 30–100)

## 2015-12-30 ENCOUNTER — Other Ambulatory Visit: Payer: Self-pay | Admitting: *Deleted

## 2015-12-30 DIAGNOSIS — E559 Vitamin D deficiency, unspecified: Secondary | ICD-10-CM

## 2016-05-27 DIAGNOSIS — E038 Other specified hypothyroidism: Secondary | ICD-10-CM | POA: Diagnosis not present

## 2016-10-15 DIAGNOSIS — S81811A Laceration without foreign body, right lower leg, initial encounter: Secondary | ICD-10-CM | POA: Diagnosis not present

## 2016-10-25 DIAGNOSIS — S81811D Laceration without foreign body, right lower leg, subsequent encounter: Secondary | ICD-10-CM | POA: Diagnosis not present

## 2016-10-25 DIAGNOSIS — Z4802 Encounter for removal of sutures: Secondary | ICD-10-CM | POA: Diagnosis not present

## 2016-10-30 DIAGNOSIS — Z4802 Encounter for removal of sutures: Secondary | ICD-10-CM | POA: Diagnosis not present

## 2016-11-09 DIAGNOSIS — Z0000 Encounter for general adult medical examination without abnormal findings: Secondary | ICD-10-CM | POA: Diagnosis not present

## 2016-11-16 DIAGNOSIS — E038 Other specified hypothyroidism: Secondary | ICD-10-CM | POA: Diagnosis not present

## 2016-11-16 DIAGNOSIS — Z0000 Encounter for general adult medical examination without abnormal findings: Secondary | ICD-10-CM | POA: Diagnosis not present

## 2016-11-16 DIAGNOSIS — Z1389 Encounter for screening for other disorder: Secondary | ICD-10-CM | POA: Diagnosis not present

## 2016-11-19 DIAGNOSIS — Z1212 Encounter for screening for malignant neoplasm of rectum: Secondary | ICD-10-CM | POA: Diagnosis not present

## 2016-12-17 ENCOUNTER — Other Ambulatory Visit: Payer: Self-pay | Admitting: Internal Medicine

## 2016-12-17 DIAGNOSIS — Z1231 Encounter for screening mammogram for malignant neoplasm of breast: Secondary | ICD-10-CM

## 2016-12-29 ENCOUNTER — Ambulatory Visit (INDEPENDENT_AMBULATORY_CARE_PROVIDER_SITE_OTHER): Payer: 59 | Admitting: Gynecology

## 2016-12-29 ENCOUNTER — Encounter: Payer: Self-pay | Admitting: Gynecology

## 2016-12-29 VITALS — BP 118/70 | Ht 69.0 in | Wt 247.0 lb

## 2016-12-29 DIAGNOSIS — N952 Postmenopausal atrophic vaginitis: Secondary | ICD-10-CM | POA: Diagnosis not present

## 2016-12-29 DIAGNOSIS — Z01411 Encounter for gynecological examination (general) (routine) with abnormal findings: Secondary | ICD-10-CM

## 2016-12-29 DIAGNOSIS — N951 Menopausal and female climacteric states: Secondary | ICD-10-CM

## 2016-12-29 NOTE — Patient Instructions (Signed)
Followup in one year for annual exam, sooner as needed. 

## 2016-12-29 NOTE — Progress Notes (Signed)
    Sabrina Donaldson Dec 19, 1960 [id number]        56 y.o.  [id number] for annual gynecologic exam.  Last menstrual period in March was no bleeding since. Also with increasing hot flushes and sweats.  Past medical history,surgical history, problem list, medications, allergies, family history and social history were all reviewed and documented as reviewed in the EPIC chart.  ROS:  Performed with pertinent positives and negatives included in the history, assessment and plan.   Additional significant findings :  None   Exam: [name] assistant Vitals:   12/29/16 0852  BP: 118/70  Weight: 247 lb (112 kg)  Height:  (1.753 m)   Body mass index is 36.48 kg/m.  General appearance:  Normal affect, orientation and appearance. Skin: Grossly normal HEENT: Without gross lesions.  No cervical or supraclavicular adenopathy. Thyroid normal.  Lungs:  Clear without wheezing, rales or rhonchi Cardiac: RR, without RMG Abdominal:  Soft, nontender, without masses, guarding, rebound, organomegaly or hernia Breasts:  Examined lying and sitting without masses, retractions, discharge or axillary adenopathy. Pelvic:  Ext, BUS, Vagina: With mild atrophic changes  Cervix: Normal. Pap smear done  Uterus: Anteverted, normal size, shape and contour, midline and mobile nontender   Adnexa: Without masses or tenderness    Anus and perineum: Normal   Rectovaginal: Normal sphincter tone without palpated masses or tenderness.    Assessment/Plan:  56 y.o. G18P2002 female for annual gynecologic exam with irregular menses, tubal sterilization.   1. Perimenopause. Patient's last menstrual period was in March. With increasing hot flushes and sweats. Discussed the perimenopause with the patient. Will keep menstrual calendar as long as less frequent but regular menses will follow. Prolonged or atypical bleeding she knows to call. Discussed treatment options for her menopausal symptoms and at this point she is not  interested in trying anything. She wants to follow for now but will call if they worsen she wants to discuss HRT. 2. Anxiety. Is on BuSpar 15 mg daily. Dr. Gisselle Galvis Lasso refill this for her. 3. History of vitamin D deficiency. Is supplementing vitamin D and Dr. Maudell Stanbrough Lasso is checking this for her. 4. Colonoscopy 2017. Repeat at their recommended interval. 5. Mammography scheduled next week. Breast exam normal today. 6. Pap smear thousand 15. Pap smear done today. History of cone biopsy 1997 with normal Pap smears afterwards. 7. Health maintenance. No routine lab work done as patient does this elsewhere. Follow up 1 year, sooner as needed.   Dara Lords MD, 9:10 AM 12/29/2016

## 2016-12-29 NOTE — Addendum Note (Signed)
Addended by: Dayna Barker on: 12/29/2016 09:51 AM   Modules accepted: Orders

## 2016-12-30 LAB — PAP IG W/ RFLX HPV ASCU

## 2017-01-03 ENCOUNTER — Ambulatory Visit: Payer: 59

## 2017-02-28 ENCOUNTER — Ambulatory Visit
Admission: RE | Admit: 2017-02-28 | Discharge: 2017-02-28 | Disposition: A | Discharge status: Home / Self Care | Payer: 59 | Source: Ambulatory Visit | Attending: Internal Medicine | Admitting: Internal Medicine

## 2017-02-28 DIAGNOSIS — Z1231 Encounter for screening mammogram for malignant neoplasm of breast: Secondary | ICD-10-CM | POA: Diagnosis not present

## 2017-05-24 DIAGNOSIS — E559 Vitamin D deficiency, unspecified: Secondary | ICD-10-CM | POA: Diagnosis not present

## 2017-05-24 DIAGNOSIS — E038 Other specified hypothyroidism: Secondary | ICD-10-CM | POA: Diagnosis not present

## 2017-11-11 DIAGNOSIS — R82998 Other abnormal findings in urine: Secondary | ICD-10-CM | POA: Diagnosis not present

## 2017-11-11 DIAGNOSIS — Z0000 Encounter for general adult medical examination without abnormal findings: Secondary | ICD-10-CM | POA: Diagnosis not present

## 2017-11-18 DIAGNOSIS — Z1389 Encounter for screening for other disorder: Secondary | ICD-10-CM | POA: Diagnosis not present

## 2017-11-18 DIAGNOSIS — Z0000 Encounter for general adult medical examination without abnormal findings: Secondary | ICD-10-CM | POA: Diagnosis not present

## 2017-11-18 DIAGNOSIS — B351 Tinea unguium: Secondary | ICD-10-CM | POA: Diagnosis not present

## 2017-11-18 DIAGNOSIS — R030 Elevated blood-pressure reading, without diagnosis of hypertension: Secondary | ICD-10-CM | POA: Diagnosis not present

## 2017-11-18 DIAGNOSIS — E876 Hypokalemia: Secondary | ICD-10-CM | POA: Diagnosis not present

## 2017-11-29 DIAGNOSIS — Z1212 Encounter for screening for malignant neoplasm of rectum: Secondary | ICD-10-CM | POA: Diagnosis not present

## 2017-12-30 ENCOUNTER — Encounter: Payer: Self-pay | Admitting: Gynecology

## 2017-12-30 ENCOUNTER — Ambulatory Visit (INDEPENDENT_AMBULATORY_CARE_PROVIDER_SITE_OTHER): Payer: 59 | Admitting: Gynecology

## 2017-12-30 VITALS — BP 118/74 | Ht 69.0 in | Wt 235.0 lb

## 2017-12-30 DIAGNOSIS — Z01419 Encounter for gynecological examination (general) (routine) without abnormal findings: Secondary | ICD-10-CM

## 2017-12-30 DIAGNOSIS — R030 Elevated blood-pressure reading, without diagnosis of hypertension: Secondary | ICD-10-CM | POA: Diagnosis not present

## 2017-12-30 NOTE — Progress Notes (Signed)
    Sabrina Donaldson 07/17/1960 [id number]        57 y.o.  [id number] for annual gynecologic exam.  Without gynecologic complaints  Past medical history,surgical history, problem list, medications, allergies, family history and social history were all reviewed and documented as reviewed in the EPIC chart.  ROS:  Performed with pertinent positives and negatives included in the history, assessment and plan.   Additional significant findings : None   Exam: [name] assistant Vitals:   12/30/17 0855  BP: 118/74  Weight: 235 lb (106.6 kg)  Height: 5\' 9"  (1.753 m)   Body mass index is 34.7 kg/m.  General appearance:  Normal affect, orientation and appearance. Skin: Grossly normal HEENT: Without gross lesions.  No cervical or supraclavicular adenopathy. Thyroid normal.  Lungs:  Clear without wheezing, rales or rhonchi Cardiac: RR, without RMG Abdominal:  Soft, nontender, without masses, guarding, rebound, organomegaly or hernia Breasts:  Examined lying and sitting without masses, retractions, discharge or axillary adenopathy. Pelvic:  Ext, BUS, Vagina: Normal  Cervix: Normal  Uterus: Anteverted, normal size, shape and contour, midline and mobile nontender   Adnexa: Without masses or tenderness    Anus and perineum: Normal   Rectovaginal: Normal sphincter tone without palpated masses or tenderness.    Assessment/Plan:  57 y.o. G59P2002 female for annual gynecologic exam.   1. Postmenopausal.  No significant menopausal symptoms or any vaginal bleeding. 2. Colonoscopy 2017.  Repeat at their recommended intervals. 3. Pap smear 2018.  No Pap smear done today.  History of cone biopsy 1997 with normal Pap smears since. 4. Mammography coming due in December and I reminded her to schedule this.  Breast exam normal today. 5. Health maintenance.  No routine lab work done as this is done at Dr. Ferd Hibbs office.  Follow-up 1 year, sooner as needed.   Dara Lords MD, 9:17 AM  12/30/2017

## 2017-12-30 NOTE — Patient Instructions (Signed)
Follow up in one year for annual exam 

## 2018-04-28 ENCOUNTER — Other Ambulatory Visit: Payer: Self-pay | Admitting: Internal Medicine

## 2018-04-28 DIAGNOSIS — Z1231 Encounter for screening mammogram for malignant neoplasm of breast: Secondary | ICD-10-CM

## 2018-05-08 ENCOUNTER — Ambulatory Visit
Admission: RE | Admit: 2018-05-08 | Discharge: 2018-05-08 | Disposition: A | Discharge status: Home / Self Care | Payer: 59 | Source: Ambulatory Visit | Attending: Internal Medicine | Admitting: Internal Medicine

## 2018-05-08 DIAGNOSIS — Z1231 Encounter for screening mammogram for malignant neoplasm of breast: Secondary | ICD-10-CM | POA: Diagnosis not present

## 2018-12-26 ENCOUNTER — Encounter: Payer: Self-pay | Admitting: Gynecology

## 2019-01-05 ENCOUNTER — Encounter: Payer: 59 | Admitting: Gynecology

## 2020-01-28 DIAGNOSIS — U071 COVID-19: Secondary | ICD-10-CM

## 2020-01-28 HISTORY — DX: COVID-19: U07.1

## 2021-03-29 DIAGNOSIS — F419 Anxiety disorder, unspecified: Secondary | ICD-10-CM

## 2021-03-29 HISTORY — DX: Anxiety disorder, unspecified: F41.9

## 2021-05-14 ENCOUNTER — Other Ambulatory Visit: Payer: Self-pay

## 2021-05-14 ENCOUNTER — Ambulatory Visit: Payer: No Typology Code available for payment source | Admitting: Radiology

## 2021-05-14 ENCOUNTER — Encounter: Payer: Self-pay | Admitting: Radiology

## 2021-05-14 VITALS — BP 124/84 | Ht 68.0 in | Wt 265.0 lb

## 2021-05-14 DIAGNOSIS — N950 Postmenopausal bleeding: Secondary | ICD-10-CM | POA: Diagnosis not present

## 2021-05-14 NOTE — Progress Notes (Signed)
° ° ° ° ° ° °  Irregular Menstruation 61yo G2P2 presents with c/o irregular bleeding x 4 days.  Patient's last menstrual period was 05/27/2016. Noticed some pelvic cramping before bleeding started. No new meds or activities. Has never been on HRT. Does have a history of cervical polyps.   Associated symptoms include: bloating and pelvic pain.  Current contraception: post menopausal status.  History of infertility: no.  History of abnormal Pap smear: no, last pap 2018 NIL  Objective: Blood pressure 124/84, height 5\' 8"  (1.727 m), weight 265 lb (120.2 kg), last menstrual period 05/27/2016.   Pelvic exam:  VULVA: normal appearing vulva with no masses, tenderness or lesions,  VAGINA: normal appearing vagina with normal color and discharge, no lesions, moderate amount of dark red/brown blood in the vault CERVIX: normal appearing cervix without discharge, lesions, or polyps UTERUS: uterus is normal size, shape, consistency and nontender ADNEXA: normal adnexa in size, nontender and no masses  Chaperone offered and declined for exam  Assessment: Postmenopausal bleeding  Plan: Ultrasound scheduled, discussed she may need EMB Monitor bleeding- if it becomes heavy or more painful will notify office

## 2021-06-15 ENCOUNTER — Other Ambulatory Visit: Payer: Self-pay

## 2021-06-15 DIAGNOSIS — N950 Postmenopausal bleeding: Secondary | ICD-10-CM

## 2021-06-15 NOTE — Progress Notes (Signed)
GYNECOLOGY  VISIT ?  ?HPI: ?61 y.o.   Single  Caucasian  female   ?[id number] with Patient's last menstrual period was 05/27/2016.   ?here for pelvic ultrasound for postmenopausal bleeding.  ? ?Seen 05/14/21 for vaginal bleeding of 4 days duration.  ?Bleeding stopped and then restarted for a few more days.  ?Some cramping prior to the bleeding.  ? ?Prior biopsy 10/31/13 showed endocervical polyp.  ? ?Not using hormonal treatment.  ? ?GYNECOLOGIC HISTORY: ?Patient's last menstrual period was 05/27/2016. ?Contraception:  Tubal/PMP ?Menopausal hormone therapy:  none ?Last mammogram:  05-08-18 Neg/Birads1--Pt.will call to schedule ?Last pap smear:   12-29-16 Neg, 10-31-13 Neg, 10-26-12 Neg:Neg HR HPV ?       ?OB History   ? ? Gravida  ?2  ? Para  ?2  ? Term  ?2  ? Preterm  ?   ? AB  ?   ? Living  ?2  ?  ? ? SAB  ?   ? IAB  ?   ? Ectopic  ?   ? Multiple  ?   ? Live Births  ?   ?   ?  ?  ?    ? ?There are no problems to display for this patient. ? ? ?Past Medical History:  ?Diagnosis Date  ? Cervical dysplasia   ? Swelling of ankle   ? Bil swelling in feet and ankles  ? Thyroid disease   ? Hypothyroid  ? ? ?Past Surgical History:  ?Procedure Laterality Date  ? CERVICAL CONE BIOPSY  1997  ? CIN II  ? TUBAL LIGATION    ? ? ?Current Outpatient Medications  ?Medication Sig Dispense Refill  ? busPIRone (BUSPAR) 10 MG tablet Take 10 mg by mouth 2 (two) times daily.    ? Cholecalciferol (VITAMIN D PO) Take by mouth. Vid D 3-Take 5000 units 3 times a week    ? hydrochlorothiazide (MICROZIDE) 12.5 MG capsule Take 12.5 mg by mouth daily.    ? levothyroxine (SYNTHROID) 100 MCG tablet Take 1 tablet (100 mcg total) by mouth daily before breakfast. 90 tablet 1  ? ?Current Facility-Administered Medications  ?Medication Dose Route Frequency Provider Last Rate Last Admin  ? 0.9 %  sodium chloride infusion  500 mL Intravenous Continuous Danis, Andreas Blower, MD      ?  ? ?ALLERGIES: Clindamycin/lincomycin ? ?Family History  ?Problem Relation Age of  Onset  ? Hyperlipidemia Mother   ? Hypertension Mother   ? Thyroid disease Mother   ? Glaucoma Mother   ? Melanoma Father   ? Hyperlipidemia Father   ? Hypertension Father   ? Heart disease Father   ? Hypertension Brother   ? Colon cancer Neg Hx   ? Esophageal cancer Neg Hx   ? Pancreatic cancer Neg Hx   ? Rectal cancer Neg Hx   ? Stomach cancer Neg Hx   ? ? ?Social History  ? ?Socioeconomic History  ? Marital status: Single  ?  Spouse name: Not on file  ? Number of children: Not on file  ? Years of education: Not on file  ? Highest education level: Not on file  ?Occupational History  ? Not on file  ?Tobacco Use  ? Smoking status: Never  ? Smokeless tobacco: Never  ?Vaping Use  ? Vaping Use: Never used  ?Substance and Sexual Activity  ? Alcohol use: Yes  ?  Alcohol/week: 0.0 standard drinks  ?  Comment: Rare  ? Drug use: No  ?  Sexual activity: Yes  ?  Birth control/protection: Surgical  ?  Comment: Tubal lig-1st intercourse 17 yo-5 partners  ?Other Topics Concern  ? Not on file  ?Social History Narrative  ? Not on file  ? ?Social Determinants of Health  ? ?Financial Resource Strain: Not on file  ?Food Insecurity: Not on file  ?Transportation Needs: Not on file  ?Physical Activity: Not on file  ?Stress: Not on file  ?Social Connections: Not on file  ?Intimate Partner Violence: Not on file  ? ? ?Review of Systems  ?All other systems reviewed and are negative. ? ?PHYSICAL EXAMINATION:   ? ?BP 128/82   Pulse 68   Ht 5\' 8"  (1.727 m)   Wt 265 lb (120.2 kg)   LMP 05/27/2016   SpO2 98%   BMI 40.29 kg/m?     ?General appearance: alert, cooperative and appears stated age ? ?Pelvic: External genitalia:  no lesions ?             Urethra:  normal appearing urethra with no masses, tenderness or lesions ?             Bartholins and Skenes: normal    ?             Vagina: normal appearing vagina with normal color and discharge, no lesions ?             Cervix: no lesions ?               ?Bimanual Exam:  Uterus:  normal size,  contour, position, consistency, mobility, non-tender ?             Adnexa: no mass, fullness, tenderness ?      ? ?Pelvic ultrasound ?Uterus 8.43 x 5.28 x 4.09 cm.  ?No myometrial masses.  ?EMS 12.78 mm.   Minimal vascularity.   ?Ovaries atrophic. ?No adnexal masses.  ?No free fluid.  ? ?EMB ?Consent done.  ?Hibiclens prep.  ?Paracervical block with 10 cc 1% lidocaine, lot 07/27/2016, exp 07/28/22.  ?Pipelle passed x 3 to almost 8 cm.  ?Tissue to pathology.  ?Silver nitrate to tenaculum site on cervix. ?No complications.  ?Minimal EBL. ? ?Chaperone was present for exam:  09/27/22, CMA ? ?ASSESSMENT ? ?Postmenopausal bleeding.  ?Thickened endometrium.  ?Status post cervical conization, CIN II. ? ?PLAN ? ?Pelvic Marchelle Folks report and images reviewed.  ?FU EMB.  ?Return in one week for discussion of results and pap with HR HPV testing.  ?  ?An After Visit Summary was printed and given to the patient. ?25 min  total time was spent for this patient encounter, including preparation, face-to-face counseling with the patient, coordination of care, and documentation of the encounter. ? ?  ? ?

## 2021-06-16 ENCOUNTER — Other Ambulatory Visit: Payer: Self-pay

## 2021-06-16 ENCOUNTER — Encounter: Payer: Self-pay | Admitting: Obstetrics and Gynecology

## 2021-06-16 ENCOUNTER — Ambulatory Visit: Payer: No Typology Code available for payment source

## 2021-06-16 ENCOUNTER — Ambulatory Visit (INDEPENDENT_AMBULATORY_CARE_PROVIDER_SITE_OTHER): Payer: No Typology Code available for payment source | Admitting: Obstetrics and Gynecology

## 2021-06-16 ENCOUNTER — Other Ambulatory Visit (HOSPITAL_COMMUNITY)
Admission: RE | Admit: 2021-06-16 | Discharge: 2021-06-16 | Disposition: A | Discharge status: Home / Self Care | Payer: Self-pay | Source: Ambulatory Visit | Attending: Obstetrics and Gynecology | Admitting: Obstetrics and Gynecology

## 2021-06-16 ENCOUNTER — Other Ambulatory Visit: Payer: No Typology Code available for payment source | Admitting: Obstetrics and Gynecology

## 2021-06-16 VITALS — BP 128/82 | HR 68 | Ht 68.0 in | Wt 265.0 lb

## 2021-06-16 DIAGNOSIS — N950 Postmenopausal bleeding: Secondary | ICD-10-CM

## 2021-06-16 DIAGNOSIS — R9389 Abnormal findings on diagnostic imaging of other specified body structures: Secondary | ICD-10-CM | POA: Insufficient documentation

## 2021-06-16 NOTE — Patient Instructions (Signed)
Endometrial Biopsy ?An endometrial biopsy is a procedure to remove tissue samples from the endometrium, which is the lining of the uterus. The tissue that is removed can then be checked under a microscope for disease. ?This procedure is used to diagnose conditions such as endometrial cancer, endometrial tuberculosis, polyps, or other inflammatory conditions. This procedure may also be used to investigate uterine bleeding to determine where you are in your menstrual cycle or how your hormone levels are affecting the lining of the uterus. ?Tell a health care provider about: ?Any allergies you have. ?All medicines you are taking, including vitamins, herbs, eye drops, creams, and over-the-counter medicines. ?Any problems you or family members have had with anesthetic medicines. ?Any blood disorders you have. ?Any surgeries you have had. ?Any medical conditions you have. ?Whether you are pregnant or may be pregnant. ?What are the risks? ?Generally, this is a safe procedure. However, problems may occur, including: ?Bleeding. ?Pelvic infection. ?Puncture of the wall of the uterus with the biopsy device (rare). ?Allergic reactions to medicines. ?What happens before the procedure? ?Keep a record of your menstrual cycles as told by your health care provider. You may need to schedule your procedure for a specific time in your cycle. ?You may want to bring a sanitary pad to wear after the procedure. ?Plan to have someone take you home from the hospital or clinic. ?Ask your health care provider about: ?Changing or stopping your regular medicines. This is especially important if you are taking diabetes medicines, arthritis medicines, or blood thinners. ?Taking medicines such as aspirin and ibuprofen. These medicines can thin your blood. Do not take these medicines unless your health care provider tells you to take them. ?Taking over-the-counter medicines, vitamins, herbs, and supplements. ?What happens during the procedure? ?You  will lie on an exam table with your feet and legs supported as in a pelvic exam. ?Your health care provider will insert an instrument (speculum) into your vagina to see your cervix. ?Your cervix will be cleansed with an antiseptic solution. ?A medicine (local anesthetic) will be used to numb the cervix. ?A forceps instrument (tenaculum) will be used to hold your cervix steady for the biopsy. ?A thin, rod-like instrument (uterine sound) will be inserted through your cervix to determine the length of your uterus and the location where the biopsy sample will be removed. ?A thin, flexible tube (catheter) will be inserted through your cervix and into the uterus. The catheter will be used to collect the biopsy sample from your endometrial tissue. ?The catheter and speculum will then be removed, and the tissue sample will be sent to a lab for examination. ?The procedure may vary among health care providers and hospitals. ?What can I expect after procedure? ?You will rest in a recovery area until you are ready to go home. ?You may have mild cramping and a small amount of vaginal bleeding. This is normal. ?You may have a small amount of vaginal bleeding for a few days. This is normal. ?It is up to you to get the results of your procedure. Ask your health care provider, or the department that is doing the procedure, when your results will be ready. ?Follow these instructions at home: ?Take over-the-counter and prescription medicines only as told by your health care provider. ?Do not douche, use tampons, or have sexual intercourse until your health care provider approves. ?Return to your normal activities as told by your health care provider. Ask your health care provider what activities are safe for you. ?Follow instructions   from your health care provider about any activity restrictions, such as restrictions on strenuous exercise or heavy lifting. ?Keep all follow-up visits. This is important. ?Contact a health care  provider: ?You have heavy bleeding, or bleed for longer than 2 days after the procedure. ?You have bad smelling discharge from your vagina. ?You have a fever or chills. ?You have a burning sensation when urinating or you have difficulty urinating. ?You have severe pain in your lower abdomen. ?Get help right away if you: ?You have severe cramps in your stomach or back. ?You pass large blood clots. ?Your bleeding increases. ?You become weak or light-headed, or you faint or lose consciousness. ?Summary ?An endometrial biopsy is a procedure to remove tissue samples is taken from the endometrium, which is the lining of the uterus. ?The tissue sample that is removed will be checked under a microscope for disease. ?This procedure is used to diagnose conditions such as endometrial cancer, endometrial tuberculosis, polyps, or other inflammatory conditions. ?After the procedure, it is common to have mild cramping and a small amount of vaginal bleeding for a few days. ?Do not douche, use tampons, or have sexual intercourse until your health care provider approves. Ask your health care provider which activities are safe for you. ?This information is not intended to replace advice given to you by your health care provider. Make sure you discuss any questions you have with your health care provider. ?Document Revised: 11/26/2020 Document Reviewed: 10/08/2019 ?Elsevier Patient Education ? 2022 Elsevier Inc[employer] ? ?

## 2021-06-18 LAB — SURGICAL PATHOLOGY

## 2021-06-22 ENCOUNTER — Telehealth: Payer: Self-pay | Admitting: *Deleted

## 2021-06-22 NOTE — Telephone Encounter (Signed)
Leda Min, RN  ?06/22/2021  4:10 PM EDT Back to Top  ?  ?Left message to call Noreene Larsson, RN at [facility], [phone], OPT 5.  ? ?

## 2021-06-22 NOTE — Telephone Encounter (Signed)
-----   Message from Patton Salles, MD sent at 06/20/2021  9:50 AM EDT ----- ?Results to patient through My Chart. ? ?Hi Sabrina Donaldson,  ? ?Your biopsy of the endometrium showed a benign polyp, which is what we were expecting.  ?No cancer was seen.  ? ?I will see you in the office this week for your pap and further discussion of treatment.  ?The polyp should be removed with outpatient surgery called a hysteroscopy with dilation and curettage.  ? ?I will answer all of your questions at your office visit in a few days.   ? ?I hope you have a good weekend! ? ?Conley Simmonds, MD ? ?

## 2021-06-23 NOTE — Telephone Encounter (Signed)
Spoke with patient. Advised of results and recommendations per Dr. Edward Jolly. Brief explanation of surgery provided. Patient is scheduled for OV on 3/29, would like to further discuss with Dr. Edward Jolly before proceeding with scheduling. Questions answered. Patient verbalizes understanding.  ? ?Routing to provider for final review. Patient is agreeable to disposition. Will close encounter. ? ?

## 2021-06-24 ENCOUNTER — Telehealth: Payer: Self-pay | Admitting: Obstetrics and Gynecology

## 2021-06-24 ENCOUNTER — Ambulatory Visit: Payer: No Typology Code available for payment source | Admitting: Obstetrics and Gynecology

## 2021-06-24 ENCOUNTER — Encounter: Payer: Self-pay | Admitting: Obstetrics and Gynecology

## 2021-06-24 ENCOUNTER — Other Ambulatory Visit (HOSPITAL_COMMUNITY)
Admission: RE | Admit: 2021-06-24 | Discharge: 2021-06-24 | Disposition: A | Discharge status: Home / Self Care | Payer: Self-pay | Source: Ambulatory Visit | Attending: Obstetrics and Gynecology | Admitting: Obstetrics and Gynecology

## 2021-06-24 VITALS — BP 138/82 | Ht 68.0 in | Wt 265.0 lb

## 2021-06-24 DIAGNOSIS — Z124 Encounter for screening for malignant neoplasm of cervix: Secondary | ICD-10-CM | POA: Diagnosis not present

## 2021-06-24 DIAGNOSIS — N840 Polyp of corpus uteri: Secondary | ICD-10-CM | POA: Diagnosis not present

## 2021-06-24 NOTE — Progress Notes (Signed)
GYNECOLOGY  VISIT ?  ?HPI: ?61 y.o.   Single  Caucasian  female   ?[id number] with Patient's last menstrual period was 05/27/2016.   ?here for 1 week follow up and pap smear.  ? ?Recent postmenopausal bleeding.  ?Pelvic US showed EMS 12.78 mm. Ovaries atrophic. ?EMB showed benign endometrial polyp. ? ?She states when she had her tubal ligation, she had additional evaluation due to concern of damage to a surrounding organ, of which she is not certain.  ?This was not the case, however.  ?She had observational care following her surgery. ? ?Grand daughter marring July 21, 2021.  ? ?GYNECOLOGIC HISTORY: ?Patient's last menstrual period was 05/27/2016. ?Contraception:  Tubal/PMP ?Menopausal hormone therapy:  none ?Last mammogram:  05-08-18 Neg/Birads1--Pt.will call to schedule ?Last pap smear:  12-29-16 Neg, 10-31-13 Neg, 10-26-12 Neg:Neg HR HPV ?       ?OB History   ? ? Gravida  ?2  ? Para  ?2  ? Term  ?2  ? Preterm  ?   ? AB  ?   ? Living  ?2  ?  ? ? SAB  ?   ? IAB  ?   ? Ectopic  ?   ? Multiple  ?   ? Live Births  ?   ?   ?  ?  ?    ? ?There are no problems to display for this patient. ? ? ?Past Medical History:  ?Diagnosis Date  ? Cervical dysplasia   ? Swelling of ankle   ? Bil swelling in feet and ankles  ? Thyroid disease   ? Hypothyroid  ? ? ?Past Surgical History:  ?Procedure Laterality Date  ? CERVICAL CONE BIOPSY  1997  ? CIN II  ? TUBAL LIGATION    ? ? ?Current Outpatient Medications  ?Medication Sig Dispense Refill  ? busPIRone (BUSPAR) 10 MG tablet Take 10 mg by mouth 2 (two) times daily.    ? Cholecalciferol (VITAMIN D PO) Take by mouth. Vid D 3-Take 5000 units 3 times a week    ? hydrochlorothiazide (HYDRODIURIL) 25 MG tablet Take 1 tablet by mouth daily.    ? levothyroxine (SYNTHROID) 100 MCG tablet Take 1 tablet (100 mcg total) by mouth daily before breakfast. 90 tablet 1  ? ?Current Facility-Administered Medications  ?Medication Dose Route Frequency Provider Last Rate Last Admin  ? 0.9 %  sodium chloride  infusion  500 mL Intravenous Continuous Danis, Andreas Blower, MD      ?  ? ?ALLERGIES: Clindamycin/lincomycin ? ?Family History  ?Problem Relation Age of Onset  ? Hyperlipidemia Mother   ? Hypertension Mother   ? Thyroid disease Mother   ? Glaucoma Mother   ? Melanoma Father   ? Hyperlipidemia Father   ? Hypertension Father   ? Heart disease Father   ? Hypertension Brother   ? Colon cancer Neg Hx   ? Esophageal cancer Neg Hx   ? Pancreatic cancer Neg Hx   ? Rectal cancer Neg Hx   ? Stomach cancer Neg Hx   ? ? ?Social History  ? ?Socioeconomic History  ? Marital status: Single  ?  Spouse name: Not on file  ? Number of children: Not on file  ? Years of education: Not on file  ? Highest education level: Not on file  ?Occupational History  ? Not on file  ?Tobacco Use  ? Smoking status: Never  ? Smokeless tobacco: Never  ?Vaping Use  ? Vaping Use: Never used  ?  Substance and Sexual Activity  ? Alcohol use: Yes  ?  Alcohol/week: 0.0 standard drinks  ?  Comment: Rare  ? Drug use: No  ? Sexual activity: Yes  ?  Birth control/protection: Surgical  ?  Comment: Tubal lig-1st intercourse 17 yo-5 partners  ?Other Topics Concern  ? Not on file  ?Social History Narrative  ? Not on file  ? ?Social Determinants of Health  ? ?Financial Resource Strain: Not on file  ?Food Insecurity: Not on file  ?Transportation Needs: Not on file  ?Physical Activity: Not on file  ?Stress: Not on file  ?Social Connections: Not on file  ?Intimate Partner Violence: Not on file  ? ? ?Review of Systems  ?All other systems reviewed and are negative. ? ?PHYSICAL EXAMINATION:   ? ?BP 138/82   Ht 5\' 8"  (1.727 m)   Wt 265 lb (120.2 kg)   LMP 05/27/2016   BMI 40.29 kg/m?     ?General appearance: alert, cooperative and appears stated age ? ?Neck:  No masses, nontender.  ?Lungs:  CTA bilaterally.  ?Cor:  S1S2 RRR. No murmur.  ?Abdomen:  soft, nontender, no organomegaly.  ?Inguinal nodes:  normal. ?Pelvic:  External genitalia:  no lesions ?             Urethra:   normal appearing urethra with no masses, tenderness or lesions ?             Bartholins and Skenes: normal    ?             Vagina: normal appearing vagina with normal color and discharge, no lesions ?             Cervix: no lesions.  Pap and HR HPV collected.  ?               ?Bimanual Exam:  Uterus:  normal size, contour, position, consistency, mobility, non-tender ?             Adnexa: no mass, fullness, tenderness ?  ?Chaperone was present for exam:  07/27/2016, CMA ? ?ASSESSMENT ? ?Hx cervical conization.  ?Cervical cancer screening.  ?Postmenopausal bleeding.  ?Endometrial polyp. ? ?PLAN ? ?Pap and HR HPV collected.  ? ?Discussion of hysteroscopy with Myosure polypectomy, dilation and curettage.  ?Risks, benefits, and alternatives reviewed. ?Risks include but are not limited to bleeding, infection, damage to surrounding organs including uterine perforation requiring hospitalization and laparoscopy, pulmonary edema, reaction to anesthesia, DVT, PE, death, need for further treatment and surgery. ?Surgical expectations and recovery discussed.  ?Patient wishes to proceed. ?ACOG brochures given to patient for hysteroscopy and dilation and curettage. ?  ?An After Visit Summary was printed and given to the patient. ? ?34 min  total time was spent for this patient encounter, including preparation, face-to-face counseling with the patient, coordination of care, and documentation of the encounter. ? ? ?

## 2021-06-27 NOTE — Telephone Encounter (Signed)
Please precert and schedule hysteroscopy with Myosure resection of endometrial polyp, dilation and curettage at [facility].  ? ?Diagnoses are:  postmenopausal bleeding and an endometrial polyp. ? ?Time needed is 30 minutes.  ? ?Preop may be needed depending on the date of surgery. ?

## 2021-06-29 NOTE — Telephone Encounter (Signed)
Left message to call Rosalio Catterton, RN at [facility], [phone].  

## 2021-06-29 NOTE — Telephone Encounter (Signed)
Left message to call Myalynn Lingle, RN at [facility], [phone].  

## 2021-06-29 NOTE — Telephone Encounter (Signed)
Spoke with patient. Reviewed surgery dates. Patient request to proceed with surgery on 08/18/21.  Patient declines multiple earlier surgery dates offered. Advised patient I will forward to business office for return call. I will return call once surgery date and time confirmed. Patient verbalizes understanding and is agreeable.  ? ?Surgery request sent.  ? ?Routing to [facility] for benefits ? ?

## 2021-06-30 LAB — CYTOLOGY - PAP
Comment: NEGATIVE
Diagnosis: NEGATIVE
High risk HPV: NEGATIVE

## 2021-06-30 NOTE — Telephone Encounter (Signed)
Spoke with patient regarding surgery benefits. Patient acknowledges understanding of information presented. Patient is aware that benefits presented are professional benefits only. Patient is aware the hospital will call with facility benefits. See account note. ° °Encounter closed. °

## 2021-06-30 NOTE — Telephone Encounter (Signed)
Spoke with patient. Surgery date request confirmed.  ?Advised surgery is scheduled for 08/18/21, [facility] at 0730.  ?Surgery instruction sheet and hospital brochure reviewed, printed copy will be mailed.  ?Patient verbalizes understanding and is agreeable.  ?Routing to [facility] for benefits, encounter can be closed after benefits reviewed.  ? ?

## 2021-06-30 NOTE — Telephone Encounter (Signed)
Left message to call Dajai Wahlert, RN at [facility], [phone], OPT 5.  

## 2021-07-08 ENCOUNTER — Telehealth: Payer: Self-pay | Admitting: *Deleted

## 2021-07-08 NOTE — Telephone Encounter (Signed)
Patient left message requesting earlier surgery date. Currently scheduled for 08/18/21.  ? ?Call returned to patient, Left message to call Noreene Larsson, RN at [facility], [phone], OPT 5. ? ?

## 2021-07-09 NOTE — Telephone Encounter (Signed)
Spoke with patient. Patient reports increased pelvic cramping on Tuesday this week, pain has since resolved, but would like to move surgery to earlier date if possible. Denies any other GYN symptoms. Reviewed surgery dates, surgery r/s to 07/27/21, [facility] at 1100, arrive at 0900. Pre-op scheduled for 4/14 and post-op rescheduled. Patient verbalizes understanding and is agreeable.  ? ?Routing to provider for final review. Patient is agreeable to disposition. Will close encounter. ? ?Cc: Hayley Carder ?

## 2021-07-09 NOTE — Progress Notes (Signed)
GYNECOLOGY  VISIT ?  ?HPI: ?61 y.o.   Single  Caucasian  female   ?[id number] with Patient's last menstrual period was 05/27/2016.   ?here for pre op exam.  ? ?Having postmenopausal bleeding.  ?Pelvic US showed EMS 12.78 mm. Ovaries atrophic. ?EMB showed benign endometrial polyp. ? ?Had some cramping in her abdomen this week that radiated into her back, so she requested to move up the surgical date. ?It felt like a menstrual cramp. ?She started bleeding again with the pain.  ?Ibuprofen eventually helped the pain.  ? ?No pain with urination, constipation or diarrhea.  ?  ?She states when she had her tubal ligation, she had additional evaluation due to concern of damage to a surrounding organ, of which she is not certain.  ?This was not the case, however.  ?She had observational care following her surgery. ? ?GYNECOLOGIC HISTORY: ?Patient's last menstrual period was 05/27/2016. ?Contraception:  Tubal/PMP ?Menopausal hormone therapy:  none ?Last mammogram:   05-08-18 Neg/Birads1--Pt.will call to schedule ?Last pap smear: 06-24-21 Neg:Neg HR HPV, 12-29-16 Neg, 10-31-13 Neg, 10-26-12 Neg:Neg HR HPV ?       ?OB History   ? ? Gravida  ?2  ? Para  ?2  ? Term  ?2  ? Preterm  ?   ? AB  ?   ? Living  ?2  ?  ? ? SAB  ?   ? IAB  ?   ? Ectopic  ?   ? Multiple  ?   ? Live Births  ?   ?   ?  ?  ?    ? ?There are no problems to display for this patient. ? ? ?Past Medical History:  ?Diagnosis Date  ? Cervical dysplasia   ? Swelling of ankle   ? Bil swelling in feet and ankles  ? Thyroid disease   ? Hypothyroid  ? ? ?Past Surgical History:  ?Procedure Laterality Date  ? CERVICAL CONE BIOPSY  1997  ? CIN II  ? TUBAL LIGATION    ? ? ?Current Outpatient Medications  ?Medication Sig Dispense Refill  ? busPIRone (BUSPAR) 10 MG tablet Take 10 mg by mouth 2 (two) times daily.    ? Cholecalciferol (VITAMIN D PO) Take by mouth. Vid D 3-Take 5000 units 3 times a week    ? hydrochlorothiazide (HYDRODIURIL) 25 MG tablet Take 1 tablet by mouth daily.    ?  levothyroxine (SYNTHROID) 100 MCG tablet Take 1 tablet (100 mcg total) by mouth daily before breakfast. 90 tablet 1  ? ?Current Facility-Administered Medications  ?Medication Dose Route Frequency Provider Last Rate Last Admin  ? 0.9 %  sodium chloride infusion  500 mL Intravenous Continuous Danis, Kirke Corin, MD      ?  ? ?ALLERGIES: Clindamycin/lincomycin ? ?Family History  ?Problem Relation Age of Onset  ? Hyperlipidemia Mother   ? Hypertension Mother   ? Thyroid disease Mother   ? Glaucoma Mother   ? Melanoma Father   ? Hyperlipidemia Father   ? Hypertension Father   ? Heart disease Father   ? Hypertension Brother   ? Colon cancer Neg Hx   ? Esophageal cancer Neg Hx   ? Pancreatic cancer Neg Hx   ? Rectal cancer Neg Hx   ? Stomach cancer Neg Hx   ? ? ?Social History  ? ?Socioeconomic History  ? Marital status: Single  ?  Spouse name: Not on file  ? Number of children: Not on file  ?  Years of education: Not on file  ? Highest education level: Not on file  ?Occupational History  ? Not on file  ?Tobacco Use  ? Smoking status: Never  ? Smokeless tobacco: Never  ?Vaping Use  ? Vaping Use: Never used  ?Substance and Sexual Activity  ? Alcohol use: Yes  ?  Alcohol/week: 0.0 standard drinks  ?  Comment: Rare  ? Drug use: No  ? Sexual activity: Yes  ?  Birth control/protection: Surgical  ?  Comment: Tubal lig-1st intercourse 27 yo-5 partners  ?Other Topics Concern  ? Not on file  ?Social History Narrative  ? Not on file  ? ?Social Determinants of Health  ? ?Financial Resource Strain: Not on file  ?Food Insecurity: Not on file  ?Transportation Needs: Not on file  ?Physical Activity: Not on file  ?Stress: Not on file  ?Social Connections: Not on file  ?Intimate Partner Violence: Not on file  ? ? ?Review of Systems  ?All other systems reviewed and are negative. ? ?PHYSICAL EXAMINATION:   ? ?BP 130/76   Pulse 68   Wt 265 lb (120.2 kg)   LMP 05/27/2016   SpO2 97%   BMI 40.29 kg/m?     ?General appearance: alert,  cooperative and appears stated age ?Head: Normocephalic, without obvious abnormality, atraumatic ?Neck: no adenopathy, supple, symmetrical, trachea midline and thyroid normal to inspection and palpation ?Lungs: clear to auscultation bilaterally ?Heart: regular rate and rhythm ?Abdomen: soft, non-tender, no masses,  no organomegaly ?Extremities: extremities normal, atraumatic,  1 - 2+ LE edema bilateral lower extremities. ?Skin: Skin color, texture, turgor normal. No rashes or lesions ?Lymph nodes: Cervical, supraclavicular, and axillary nodes normal. ?No abnormal inguinal nodes palpated ?Neurologic: Grossly normal ? ?Pelvic: External genitalia:  no lesions ?             Urethra:  normal appearing urethra with no masses, tenderness or lesions ?             Bartholins and Skenes: normal    ?             Vagina: normal appearing vagina with normal color and discharge, no lesions ?             Cervix: no lesions.  Old blood noted from the cervical os.  ?               ?Bimanual Exam:  Uterus:  normal size, contour, position, consistency, mobility, non-tender ?             Adnexa: no mass, fullness, tenderness ?  ? ?Chaperone was present for exam:  Estill Bamberg, CMA ? ?ASSESSMENT ? ?Postmenopausal bleeding.  ?Endometrial polyp. ?Hx cervical conization.  Normal pap and negative HR HPV.  ? ?PLAN ? ?Will plan with hysteroscopic polypectomy with Myosure, dilation and curettage.  ?Risks, benefits, and alternatives reviewed with the patient who wishes to proceed.  ?Surgical expectations and recover reviewed.  ?  ?An After Visit Summary was printed and given to the patient. ?  ? ?

## 2021-07-10 ENCOUNTER — Encounter: Payer: Self-pay | Admitting: Obstetrics and Gynecology

## 2021-07-10 ENCOUNTER — Ambulatory Visit (INDEPENDENT_AMBULATORY_CARE_PROVIDER_SITE_OTHER): Payer: No Typology Code available for payment source | Admitting: Obstetrics and Gynecology

## 2021-07-10 VITALS — BP 130/76 | HR 68 | Wt 265.0 lb

## 2021-07-10 DIAGNOSIS — N840 Polyp of corpus uteri: Secondary | ICD-10-CM

## 2021-07-10 DIAGNOSIS — N950 Postmenopausal bleeding: Secondary | ICD-10-CM

## 2021-07-12 NOTE — H&P (Signed)
Office Visit ?07/10/2021 ?[facility] ?Patton Salles, MD ?Obstetrics and Gynecology Post-menopausal bleeding +1 more ?Dx Pre-op Exam ; Referred by Creola Corn, MD ?Reason for Visit  ? ?Additional Documentation ? ?Vitals:  BP 130/76 ?Pulse 68 ?Wt 120.2 kg ?LMP 05/27/2016 ?SpO2 97% ?BMI 40.29 kg/m? ?BSA 2.4 m?  ?Flowsheets:  NEWS, ?MEWS Score, ?Anthropometrics ?  ?Encounter Info:  Billing Info, ?History, ?Allergies, ?Detailed Report ?  ? ?All Notes ? ? ? Progress Notes by Patton Salles, MD at 07/10/2021 2:00 PM ? ?Author: Patton Salles, MD Author Type: Physician Filed: 07/10/2021 12:23 PM  ?Note Status: Signed Cosign: Cosign Not Required Encounter Date: 07/10/2021  ?Editor: Patton Salles, MD (Physician)      ?Prior Versions: 1. Alphonsa Overall, CMA (Certified Medical Assistant) at 07/10/2021 11:38 AM - Sign when Signing Visit  ?  ?GYNECOLOGY  VISIT ?  ?HPI: ?61 y.o.   Single  Caucasian  female   ?[id number] with Patient's last menstrual period was 05/27/2016.   ?here for pre op exam.  ?  ?Having postmenopausal bleeding.  ?Pelvic US showed EMS 12.78 mm. Ovaries atrophic. ?EMB showed benign endometrial polyp. ?  ?Had some cramping in her abdomen this week that radiated into her back, so she requested to move up the surgical date. ?It felt like a menstrual cramp. ?She started bleeding again with the pain.  ?Ibuprofen eventually helped the pain.  ?  ?No pain with urination, constipation or diarrhea.  ?  ?She states when she had her tubal ligation, she had additional evaluation due to concern of damage to a surrounding organ, of which she is not certain.  ?This was not the case, however.  ?She had observational care following her surgery. ?  ?GYNECOLOGIC HISTORY: ?Patient's last menstrual period was 05/27/2016. ?Contraception:  Tubal/PMP ?Menopausal hormone therapy:  none ?Last mammogram:   05-08-18 Neg/Birads1--Pt.will call to schedule ?Last pap smear: 06-24-21  Neg:Neg HR HPV, 12-29-16 Neg, 10-31-13 Neg, 10-26-12 Neg:Neg HR HPV ?       ?OB History   ?  ?  Gravida  ?2  ? Para  ?2  ? Term  ?2  ? Preterm  ?   ? AB  ?   ? Living  ?2  ?  ?  ?  SAB  ?   ? IAB  ?   ? Ectopic  ?   ? Multiple  ?   ? Live Births  ?   ?    ?  ?   ?    ?  ?There are no problems to display for this patient. ?  ?  ?    ?Past Medical History:  ?Diagnosis Date  ? Cervical dysplasia    ? Swelling of ankle    ?  Bil swelling in feet and ankles  ? Thyroid disease    ?  Hypothyroid  ?  ?  ?     ?Past Surgical History:  ?Procedure Laterality Date  ? CERVICAL CONE BIOPSY   1997  ?  CIN II  ? TUBAL LIGATION      ?  ?  ?      ?Current Outpatient Medications  ?Medication Sig Dispense Refill  ? busPIRone (BUSPAR) 10 MG tablet Take 10 mg by mouth 2 (two) times daily.      ? Cholecalciferol (VITAMIN D PO) Take by mouth. Vid D 3-Take 5000 units 3 times a week      ?  hydrochlorothiazide (HYDRODIURIL) 25 MG tablet Take 1 tablet by mouth daily.      ? levothyroxine (SYNTHROID) 100 MCG tablet Take 1 tablet (100 mcg total) by mouth daily before breakfast. 90 tablet 1  ?  ?         ?Current Facility-Administered Medications  ?Medication Dose Route Frequency Provider Last Rate Last Admin  ? 0.9 %  sodium chloride infusion  500 mL Intravenous Continuous Danis, Andreas BlowerHenry L III, MD      ?  ?  ?ALLERGIES: Clindamycin/lincomycin ?  ?     ?Family History  ?Problem Relation Age of Onset  ? Hyperlipidemia Mother    ? Hypertension Mother    ? Thyroid disease Mother    ? Glaucoma Mother    ? Melanoma Father    ? Hyperlipidemia Father    ? Hypertension Father    ? Heart disease Father    ? Hypertension Brother    ? Colon cancer Neg Hx    ? Esophageal cancer Neg Hx    ? Pancreatic cancer Neg Hx    ? Rectal cancer Neg Hx    ? Stomach cancer Neg Hx    ?  ?  ?Social History  ?  ?     ?Socioeconomic History  ? Marital status: Single  ?    Spouse name: Not on file  ? Number of children: Not on file  ? Years of education: Not on file  ? Highest  education level: Not on file  ?Occupational History  ? Not on file  ?Tobacco Use  ? Smoking status: Never  ? Smokeless tobacco: Never  ?Vaping Use  ? Vaping Use: Never used  ?Substance and Sexual Activity  ? Alcohol use: Yes  ?    Alcohol/week: 0.0 standard drinks  ?    Comment: Rare  ? Drug use: No  ? Sexual activity: Yes  ?    Birth control/protection: Surgical  ?    Comment: Tubal lig-1st intercourse 17 yo-5 partners  ?Other Topics Concern  ? Not on file  ?Social History Narrative  ? Not on file  ?  ?Social Determinants of Health  ?  ?Financial Resource Strain: Not on file  ?Food Insecurity: Not on file  ?Transportation Needs: Not on file  ?Physical Activity: Not on file  ?Stress: Not on file  ?Social Connections: Not on file  ?Intimate Partner Violence: Not on file  ?  ?  ?Review of Systems  ?All other systems reviewed and are negative. ?  ?PHYSICAL EXAMINATION:   ?  ?BP 130/76   Pulse 68   Wt 265 lb (120.2 kg)   LMP 05/27/2016   SpO2 97%   BMI 40.29 kg/m?     ?General appearance: alert, cooperative and appears stated age ?Head: Normocephalic, without obvious abnormality, atraumatic ?Neck: no adenopathy, supple, symmetrical, trachea midline and thyroid normal to inspection and palpation ?Lungs: clear to auscultation bilaterally ?Heart: regular rate and rhythm ?Abdomen: soft, non-tender, no masses,  no organomegaly ?Extremities: extremities normal, atraumatic,  1 - 2+ LE edema bilateral lower extremities. ?Skin: Skin color, texture, turgor normal. No rashes or lesions ?Lymph nodes: Cervical, supraclavicular, and axillary nodes normal. ?No abnormal inguinal nodes palpated ?Neurologic: Grossly normal ?  ?Pelvic: External genitalia:  no lesions ?             Urethra:  normal appearing urethra with no masses, tenderness or lesions ?             Bartholins and  Skenes: normal    ?             Vagina: normal appearing vagina with normal color and discharge, no lesions ?             Cervix: no lesions.  Old blood  noted from the cervical os.  ?               ?Bimanual Exam:  Uterus:  normal size, contour, position, consistency, mobility, non-tender ?             Adnexa: no mass, fullness, tenderness ?  ?  ?Chaperone was present for exam:  Marchelle Folks, CMA ?  ?ASSESSMENT ?  ?Postmenopausal bleeding.  ?Endometrial polyp. ?Hx cervical conization.  Normal pap and negative HR HPV.  ?  ?PLAN ?  ?Will plan with hysteroscopic polypectomy with Myosure, dilation and curettage.  ?Risks, benefits, and alternatives reviewed with the patient who wishes to proceed.  ?Surgical expectations and recover reviewed.  ?  ?An After Visit Summary was printed and given to the patient. ?  ?  ?  ?  ? ? ?

## 2021-07-14 ENCOUNTER — Other Ambulatory Visit: Payer: Self-pay | Admitting: Internal Medicine

## 2021-07-14 DIAGNOSIS — Z1231 Encounter for screening mammogram for malignant neoplasm of breast: Secondary | ICD-10-CM

## 2021-07-23 ENCOUNTER — Encounter (HOSPITAL_COMMUNITY)
Admission: RE | Admit: 2021-07-23 | Discharge: 2021-07-23 | Disposition: A | Discharge status: Home / Self Care | Payer: No Typology Code available for payment source | Source: Ambulatory Visit | Attending: Obstetrics and Gynecology | Admitting: Obstetrics and Gynecology

## 2021-07-23 DIAGNOSIS — N950 Postmenopausal bleeding: Secondary | ICD-10-CM | POA: Diagnosis not present

## 2021-07-23 DIAGNOSIS — Z01812 Encounter for preprocedural laboratory examination: Secondary | ICD-10-CM | POA: Insufficient documentation

## 2021-07-23 DIAGNOSIS — N840 Polyp of corpus uteri: Secondary | ICD-10-CM

## 2021-07-23 LAB — CBC
HCT: 43.1 % (ref 36.0–46.0)
Hemoglobin: 14.3 g/dL (ref 12.0–15.0)
MCH: 27 pg (ref 26.0–34.0)
MCHC: 33.2 g/dL (ref 30.0–36.0)
MCV: 81.5 fL (ref 80.0–100.0)
Platelets: 384 10*3/uL (ref 150–400)
RBC: 5.29 MIL/uL — ABNORMAL HIGH (ref 3.87–5.11)
RDW: 14.5 % (ref 11.5–15.5)
WBC: 9.7 10*3/uL (ref 4.0–10.5)
nRBC: 0 % (ref 0.0–0.2)

## 2021-07-23 LAB — BASIC METABOLIC PANEL
Anion gap: 8 (ref 5–15)
BUN: 16 mg/dL (ref 6–20)
CO2: 29 mmol/L (ref 22–32)
Calcium: 9.1 mg/dL (ref 8.9–10.3)
Chloride: 103 mmol/L (ref 98–111)
Creatinine, Ser: 1.02 mg/dL — ABNORMAL HIGH (ref 0.44–1.00)
GFR, Estimated: >60 mL/min (ref >60)
Glucose, Bld: 102 mg/dL — ABNORMAL HIGH (ref 70–99)
Potassium: 3.3 mmol/L — ABNORMAL LOW (ref 3.5–5.1)
Sodium: 140 mmol/L (ref 135–145)

## 2021-07-24 ENCOUNTER — Encounter (HOSPITAL_BASED_OUTPATIENT_CLINIC_OR_DEPARTMENT_OTHER): Payer: Self-pay | Admitting: Obstetrics and Gynecology

## 2021-07-24 ENCOUNTER — Other Ambulatory Visit: Payer: Self-pay

## 2021-07-24 NOTE — Progress Notes (Signed)
Spoke w/ via phone for pre-op interview---[place] ?Lab needs dos----none               ?Lab results------07/23/2021 lab appt for CBC, BMP ?COVID test -----patient states asymptomatic no test needed ?Arrive at -------0900 on Monday, 07/27/2021 ?NPO after MN NO Solid Food.  Clear liquids from MN until---0800 ?Med rec completed ?Medications to take morning of surgery -----Synthroid ?Diabetic medication -----n/a ?You may shower with your normal soap morning of your surgery, please wash wash your belly button thoroughly morning of your surgery ?Patient instructed no nail polish to be worn day of surgery ?Patient instructed to bring photo id and insurance card day of surgery ?Patient aware to have Driver (ride ) / caregiver    for 24 hours after surgery - Dad & niece ?Patient Special Instructions -----none ?Pre-Op special Istructions -----none ?Patient verbalized understanding of instructions that were given at this phone interview. ?Patient denies shortness of breath, chest pain, fever, cough at this phone interview.  ?

## 2021-07-27 ENCOUNTER — Ambulatory Visit (HOSPITAL_BASED_OUTPATIENT_CLINIC_OR_DEPARTMENT_OTHER)
Admission: RE | Admit: 2021-07-27 | Discharge: 2021-07-27 | Disposition: A | Discharge status: Home / Self Care | Payer: No Typology Code available for payment source | Attending: Obstetrics and Gynecology | Admitting: Obstetrics and Gynecology

## 2021-07-27 ENCOUNTER — Ambulatory Visit (HOSPITAL_BASED_OUTPATIENT_CLINIC_OR_DEPARTMENT_OTHER): Payer: No Typology Code available for payment source | Admitting: Anesthesiology

## 2021-07-27 ENCOUNTER — Encounter (HOSPITAL_BASED_OUTPATIENT_CLINIC_OR_DEPARTMENT_OTHER)
Admission: RE | Disposition: A | Discharge status: Home / Self Care | Payer: Self-pay | Source: Home / Self Care | Attending: Obstetrics and Gynecology

## 2021-07-27 ENCOUNTER — Ambulatory Visit: Payer: Self-pay

## 2021-07-27 ENCOUNTER — Encounter (HOSPITAL_BASED_OUTPATIENT_CLINIC_OR_DEPARTMENT_OTHER): Payer: Self-pay | Admitting: Obstetrics and Gynecology

## 2021-07-27 ENCOUNTER — Other Ambulatory Visit: Payer: Self-pay

## 2021-07-27 DIAGNOSIS — N840 Polyp of corpus uteri: Secondary | ICD-10-CM | POA: Diagnosis not present

## 2021-07-27 DIAGNOSIS — E039 Hypothyroidism, unspecified: Secondary | ICD-10-CM

## 2021-07-27 DIAGNOSIS — N950 Postmenopausal bleeding: Secondary | ICD-10-CM | POA: Diagnosis not present

## 2021-07-27 HISTORY — DX: Hypothyroidism, unspecified: E03.9

## 2021-07-27 HISTORY — DX: Headache, unspecified: R51.9

## 2021-07-27 HISTORY — PX: DILATATION & CURETTAGE/HYSTEROSCOPY WITH MYOSURE: SHX6511

## 2021-07-27 HISTORY — DX: Presence of spectacles and contact lenses: Z97.3

## 2021-07-27 SURGERY — DILATATION & CURETTAGE/HYSTEROSCOPY WITH MYOSURE
Anesthesia: General | Site: Vagina

## 2021-07-27 MED ORDER — LIDOCAINE HCL 1 % IJ SOLN
INTRAMUSCULAR | Status: DC | PRN
  Administered 2021-07-27: 10 mL

## 2021-07-27 MED ORDER — PROPOFOL 10 MG/ML IV BOLUS
INTRAVENOUS | Status: AC
  Filled 2021-07-27: qty 20

## 2021-07-27 MED ORDER — POVIDONE-IODINE 10 % EX SWAB: 2.0000 "application " | Freq: Once | CUTANEOUS | Status: DC

## 2021-07-27 MED ORDER — PROPOFOL 10 MG/ML IV BOLUS
INTRAVENOUS | Status: DC | PRN
  Administered 2021-07-27: 200 mg via INTRAVENOUS

## 2021-07-27 MED ORDER — OXYCODONE HCL 5 MG/5ML PO SOLN: 5.0000 mg | Freq: Once | ORAL | Status: DC | PRN

## 2021-07-27 MED ORDER — OXYCODONE HCL 5 MG PO TABS: 5.0000 mg | ORAL_TABLET | Freq: Once | ORAL | Status: DC | PRN

## 2021-07-27 MED ORDER — DEXAMETHASONE SODIUM PHOSPHATE 10 MG/ML IJ SOLN
INTRAMUSCULAR | Status: DC | PRN
  Administered 2021-07-27: 10 mg via INTRAVENOUS

## 2021-07-27 MED ORDER — MIDAZOLAM HCL 2 MG/2ML IJ SOLN
INTRAMUSCULAR | Status: DC | PRN
Start: 2021-07-27 — End: 2021-07-27
  Administered 2021-07-27: 2 mg via INTRAVENOUS

## 2021-07-27 MED ORDER — IBUPROFEN 400 MG PO TABS: 400.0000 mg | ORAL_TABLET | Freq: Four times a day (QID) | ORAL | 0 refills | Status: AC | PRN

## 2021-07-27 MED ORDER — FENTANYL CITRATE (PF) 100 MCG/2ML IJ SOLN
INTRAMUSCULAR | Status: AC
Start: 2021-07-27 — End: ?
  Filled 2021-07-27: qty 2

## 2021-07-27 MED ORDER — MIDAZOLAM HCL 2 MG/2ML IJ SOLN
INTRAMUSCULAR | Status: AC
  Filled 2021-07-27: qty 2

## 2021-07-27 MED ORDER — ONDANSETRON HCL 4 MG/2ML IJ SOLN
INTRAMUSCULAR | Status: DC | PRN
Start: 2021-07-27 — End: 2021-07-27
  Administered 2021-07-27: 4 mg via INTRAVENOUS

## 2021-07-27 MED ORDER — SCOPOLAMINE 1 MG/3DAYS TD PT72
1.0000 | MEDICATED_PATCH | TRANSDERMAL | Status: DC
  Administered 2021-07-27: 1.5 mg via TRANSDERMAL

## 2021-07-27 MED ORDER — SCOPOLAMINE 1 MG/3DAYS TD PT72
MEDICATED_PATCH | TRANSDERMAL | Status: AC
  Filled 2021-07-27: qty 1

## 2021-07-27 MED ORDER — FENTANYL CITRATE (PF) 250 MCG/5ML IJ SOLN
INTRAMUSCULAR | Status: DC | PRN
  Administered 2021-07-27: 25 ug via INTRAVENOUS
  Administered 2021-07-27: 50 ug via INTRAVENOUS

## 2021-07-27 MED ORDER — FENTANYL CITRATE (PF) 100 MCG/2ML IJ SOLN
INTRAMUSCULAR | Status: AC
  Filled 2021-07-27: qty 2

## 2021-07-27 MED ORDER — FENTANYL CITRATE (PF) 100 MCG/2ML IJ SOLN
25.0000 ug | INTRAMUSCULAR | Status: DC | PRN
  Administered 2021-07-27 (×2): 25 ug via INTRAVENOUS
  Administered 2021-07-27: 50 ug via INTRAVENOUS

## 2021-07-27 MED ORDER — LACTATED RINGERS IV SOLN: INTRAVENOUS | Status: DC

## 2021-07-27 MED ORDER — SODIUM CHLORIDE 0.9 % IR SOLN
Status: DC | PRN
  Administered 2021-07-27: 1500 mL

## 2021-07-27 MED ORDER — KETOROLAC TROMETHAMINE 30 MG/ML IJ SOLN
INTRAMUSCULAR | Status: DC | PRN
  Administered 2021-07-27: 30 mg via INTRAVENOUS

## 2021-07-27 MED ORDER — AMISULPRIDE (ANTIEMETIC) 5 MG/2ML IV SOLN: 10.0000 mg | Freq: Once | INTRAVENOUS | Status: DC | PRN

## 2021-07-27 MED ORDER — ACETAMINOPHEN 500 MG PO TABS
1000.0000 mg | ORAL_TABLET | Freq: Once | ORAL | Status: AC
  Administered 2021-07-27: 1000 mg via ORAL

## 2021-07-27 MED ORDER — ACETAMINOPHEN 500 MG PO TABS
ORAL_TABLET | ORAL | Status: AC
  Filled 2021-07-27: qty 2

## 2021-07-27 MED ORDER — SILVER NITRATE-POT NITRATE 75-25 % EX MISC
CUTANEOUS | Status: DC | PRN
Start: 2021-07-27 — End: 2021-07-27
  Administered 2021-07-27: 2

## 2021-07-27 MED ORDER — LIDOCAINE 2% (20 MG/ML) 5 ML SYRINGE
INTRAMUSCULAR | Status: DC | PRN
Start: 2021-07-27 — End: 2021-07-27
  Administered 2021-07-27: 60 mg via INTRAVENOUS

## 2021-07-27 MED ORDER — ONDANSETRON HCL 4 MG/2ML IJ SOLN: 4.0000 mg | Freq: Once | INTRAMUSCULAR | Status: DC | PRN

## 2021-07-27 SURGICAL SUPPLY — 20 items
CATH ROBINSON RED A/P 16FR (CATHETERS) ×3 IMPLANT
DEVICE MYOSURE LITE (MISCELLANEOUS) IMPLANT
DEVICE MYOSURE REACH (MISCELLANEOUS) ×1 IMPLANT
DILATOR CANAL MILEX (MISCELLANEOUS) IMPLANT
DRSG TELFA 3X8 NADH (GAUZE/BANDAGES/DRESSINGS) ×2 IMPLANT
GAUZE 4X4 16PLY ~~LOC~~+RFID DBL (SPONGE) ×5 IMPLANT
GLOVE BIO SURGEON STRL SZ 6.5 (GLOVE) ×3 IMPLANT
GOWN STRL REUS W/TWL LRG LVL3 (GOWN DISPOSABLE) ×3 IMPLANT
IV NS IRRIG 3000ML ARTHROMATIC (IV SOLUTION) ×3 IMPLANT
KIT PROCEDURE FLUENT (KITS) ×3 IMPLANT
KIT TURNOVER CYSTO (KITS) ×3 IMPLANT
MYOSURE XL FIBROID (MISCELLANEOUS)
PACK VAGINAL MINOR WOMEN LF (CUSTOM PROCEDURE TRAY) ×3 IMPLANT
PAD DRESSING TELFA 3X8 NADH (GAUZE/BANDAGES/DRESSINGS) ×2 IMPLANT
PAD OB MATERNITY 4.3X12.25 (PERSONAL CARE ITEMS) ×3 IMPLANT
SEAL CERVICAL OMNI LOK (ABLATOR) IMPLANT
SEAL ROD LENS SCOPE MYOSURE (ABLATOR) ×3 IMPLANT
SOL PREP POV-IOD 4OZ 10% (MISCELLANEOUS) ×1 IMPLANT
SYSTEM TISS REMOVAL MYOSURE XL (MISCELLANEOUS) IMPLANT
TOWEL OR 17X26 10 PK STRL BLUE (TOWEL DISPOSABLE) ×3 IMPLANT

## 2021-07-27 NOTE — Anesthesia Preprocedure Evaluation (Addendum)
Anesthesia Evaluation  ?Patient identified by MRN, date of birth, ID band ?Patient awake ? ? ? ?Reviewed: ?Allergy & Precautions, NPO status , Patient's Chart, lab work & pertinent test results ? ?Airway ?Mallampati: II ? ?TM Distance: <3 FB ?Neck ROM: Full ? ? ? Dental ?no notable dental hx. ? ?  ?Pulmonary ?neg pulmonary ROS,  ?  ?Pulmonary exam normal ?breath sounds clear to auscultation ? ? ? ? ? ? Cardiovascular ?Exercise Tolerance: Good ?negative cardio ROS ?Normal cardiovascular exam ?Rhythm:Regular Rate:Normal ? ? ?  ?Neuro/Psych ? Headaches, PSYCHIATRIC DISORDERS Anxiety   ? GI/Hepatic ?negative GI ROS, Neg liver ROS,   ?Endo/Other  ?Hypothyroidism  ? Renal/GU ?negative Renal ROS  ?negative genitourinary ?  ?Musculoskeletal ?negative musculoskeletal ROS ?(+)  ? Abdominal ?  ?Peds ?negative pediatric ROS ?(+)  Hematology ?negative hematology ROS ?(+)   ?Anesthesia Other Findings ? ? Reproductive/Obstetrics ?negative OB ROS ? ?  ? ? ? ? ? ? ? ? ? ? ? ? ? ?  ?  ? ? ? ? ? ? ? ?Anesthesia Physical ?Anesthesia Plan ? ?ASA: 3 ? ?Anesthesia Plan: General  ? ?Post-op Pain Management: Tylenol PO (pre-op)*  ? ?Induction: Intravenous ? ?PONV Risk Score and Plan: 3 and Treatment may vary due to age or medical condition, Midazolam, Ondansetron and Dexamethasone ? ?Airway Management Planned: LMA ? ?Additional Equipment:  ? ?Intra-op Plan:  ? ?Post-operative Plan: Extubation in OR ? ?Informed Consent: I have reviewed the patients History and Physical, chart, labs and discussed the procedure including the risks, benefits and alternatives for the proposed anesthesia with the patient or authorized representative who has indicated his/her understanding and acceptance.  ? ? ? ?Dental advisory given ? ?Plan Discussed with: CRNA, Anesthesiologist and Surgeon ? ?Anesthesia Plan Comments:   ? ? ? ? ? ? ?Anesthesia Quick Evaluation ? ?

## 2021-07-27 NOTE — Transfer of Care (Signed)
Immediate Anesthesia Transfer of Care Note ? ?Patient: Sabrina Donaldson ? ?Procedure(s) Performed: DILATATION & CURETTAGE/HYSTEROSCOPY WITH MYOSURE (Vagina ) ? ?Patient Location: PACU ? ?Anesthesia Type:General ? ?Level of Consciousness: awake, alert  and oriented ? ?Airway & Oxygen Therapy: Patient Spontanous Breathing ? ?Post-op Assessment: Report given to RN and Post -op Vital signs reviewed and stable ? ?Post vital signs: Reviewed and stable ? ?Last Vitals:  ?Vitals Value Taken Time  ?BP 126/77 07/27/21 1155  ?Temp    ?Pulse 49 07/27/21 1155  ?Resp 10 07/27/21 1155  ?SpO2 94 % 07/27/21 1155  ?Vitals shown include unvalidated device data. ? ?Last Pain:  ?Vitals:  ? 07/27/21 0935  ?TempSrc: Oral  ?PainSc: 0-No pain  ?   ? ?Patients Stated Pain Goal: 7 (07/27/21 0935) ? ?Complications: No notable events documented. ?

## 2021-07-27 NOTE — Op Note (Signed)
OPERATIVE REPORT ? ?PREOPERATIVE DIAGNOSES:   Postmenopausal bleeding, endometrial polyp ? ?POSTOPERATIVE DIAGNOSES:   Postmenopausal bleeding, endometrial polyp ? ?PROCEDURE:  Hysteroscopy with dilation and curettage and Myosure resection of endometrial polyp ? ?SURGEON:  Randye Lobo, MD ? ?ANESTHESIA:  LMA, paracervical block with 10 mL of 1% lidocaine. ? ?IV FLUIDS:  500 cc LR ? ?EBL:   10 cc ? ?URINE OUTPUT:   20 cc ? ?NORMAL SALINE DEFICIT:   125 cc ? ?COMPLICATIONS:  None. ? ?INDICATIONS FOR THE PROCEDURE:    ? ?The patient is a 61 year old [id number] Caucasian female who presents with postmenopausal bleeding.  ?Pelvic ultrasound showed an endometrial thickening of 12.78 mm.   ?Her endometrial biopsy showed a benign endometrial polyp.  ?A plan is now made to proceed with a hysteroscopy with dilation and curettage and Myosurgical resection of endometrial polyp; after risks, benefits and alternatives were reviewed. ? ?FINDINGS:  Exam under anesthesia revealed a small anteverted, mobile uterus.  No adnexal masses were noted.  ?The uterus was sounded to 7 cm.  ?Hysteroscopy showed a 2 x 1 cm posterior fundal polyp with a large base.  ?The tubal ostia regions were normal. ?Endometrial currettings were scant. ? ?SPECIMENS:   The endometrial polyp and endometrial curettings were sent to pathology separately. ? ?PROCEDURE IN DETAIL:  The patient was reidentified in the preoperative ?hold area.  She received TED hose and PAS stockings for DVT prophylaxis. ? ?In the operating room, the patient was placed in the dorsal lithotomy ?position and then an LMA anesthetic was introduced.  The patient's lower ?abdomen, vagina and perineum were sterilely prepped with Betadine and the  ?patient's bladder was catheterized of urine.  She was sterilly draped. ? ?An exam under anesthesia was performed. ? ?A speculum was placed inside the vagina and a single-tooth tenaculum was ?placed on the anterior cervical lip.  A paracervical  block was performed ?with a total of 10 mL of 1% lidocaine plain.  The uterus was sounded. ?The cervix was dilated to a #21 Pratt dilator.  The MyoSure hysteroscope ?was then inserted inside the uterine cavity under the continuous infusion of normal saline solution.   ?Findings are as noted above.  The Myosure Reach device was used to resect the endometrial polyp without difficulty.  ?The MyoSure hysteroscope was removed after the polyp was resected.  ?The serrated and then sharp curettes were introduced into the uterine cavity and the endometrium was curetted in all 4 quadrants.   ?A minimal amount of endometrial curettings was obtained.  This specimen was sent to Pathology. ? ?The single-tooth tenaculum which had been placed on the anterior cervical lip was removed.     ?There was bleeding from the tenaculum sites, which was treated with silver nitrate sticks.  ?Hemostasis was then good, and all of the vaginal instruments were removed. ? ?The patient was awakened and escorted to the recovery room in stable ?condition after she was cleansed of Betadine.  There were no complications to the procedure.   ?All needle, instrument and sponge counts were correct. ? ?Randye Lobo, MD  ? ? ? ?

## 2021-07-27 NOTE — Anesthesia Postprocedure Evaluation (Signed)
Anesthesia Post Note ? ?Patient: [name] ? ?Procedure(s) Performed: DILATATION & CURETTAGE/HYSTEROSCOPY WITH MYOSURE (Vagina ) ? ?  ? ?Patient location during evaluation: PACU ?Anesthesia Type: General ?Level of consciousness: awake and alert ?Pain management: pain level controlled ?Vital Signs Assessment: post-procedure vital signs reviewed and stable ?Respiratory status: spontaneous breathing, nonlabored ventilation and respiratory function stable ?Cardiovascular status: blood pressure returned to baseline and stable ?Postop Assessment: no apparent nausea or vomiting ?Anesthetic complications: no ? ? ?No notable events documented. ? ?Last Vitals:  ?Vitals:  ? 07/27/21 1245 07/27/21 1308  ?BP: 95/66 97/63  ?Pulse: (!) 44 (!) 54  ?Resp: 10 14  ?Temp:  36.4 ?C  ?SpO2: 93% 97%  ?  ?Last Pain:  ?Vitals:  ? 07/27/21 1308  ?TempSrc:   ?PainSc: 0-No pain  ? ? ?  ?  ?  ?  ?  ?  ? ?Mellody Dance ? ? ? ? ?

## 2021-07-27 NOTE — Progress Notes (Signed)
Update to History and Physical ? ?No marked change in status since office preop visit.  ?Preop labs showed K 3.3.  ? ?Vitals:  ? 07/27/21 0935  ?BP: 136/83  ?Pulse: 66  ?Resp: 18  ?Temp: 97.8 ?F (36.6 ?C)  ?SpO2: 97%  ? ? ?Patient examined.  ? ?OK to proceed with surgery.  ? ?

## 2021-07-27 NOTE — Discharge Instructions (Addendum)
Hi Sabrina Donaldson,  ? ?I found and removed a polyp from your uterus.  ?Everything went well! ? ?I will see you in the office for your post op visit.  ? ?Conley Simmonds, MD ? ? ?Post Anesthesia Home Care Instructions ? ?Activity: ?Get plenty of rest for the remainder of the day. A responsible individual must stay with you for 24 hours following the procedure.  ?For the next 24 hours, DO NOT: ?-Drive a car ?-Advertising copywriter ?-Drink alcoholic beverages ?-Take any medication unless instructed by your physician ?-Make any legal decisions or sign important papers. ? ?Meals: ?Start with liquid foods such as gelatin or soup. Progress to regular foods as tolerated. Avoid greasy, spicy, heavy foods. If nausea and/or vomiting occur, drink only clear liquids until the nausea and/or vomiting subsides. Call your physician if vomiting continues. ? ?Special Instructions/Symptoms: ?Your throat may feel dry or sore from the anesthesia or the breathing tube placed in your throat during surgery. If this causes discomfort, gargle with warm salt water. The discomfort should disappear within 24 hours. ? ?If you had a scopolamine patch placed behind your ear for the management of post- operative nausea and/or vomiting: ? ?1. The medication in the patch is effective for 72 hours, after which it should be removed.  Wrap patch in a tissue and discard in the trash. Wash hands thoroughly with soap and water. ?2. You may remove the patch earlier than 72 hours if you experience unpleasant side effects which may include dry mouth, dizziness or visual disturbances. ?3. Avoid touching the patch. Wash your hands with soap and water after contact with the patch. ? ?Remove patch behind right ear by Thursday, Jul 30, 2021. ? ?

## 2021-07-27 NOTE — Anesthesia Procedure Notes (Signed)
Procedure Name: LMA Insertion ?Date/Time: 07/27/2021 11:11 AM ?Performed by: Clearnce Sorrel, CRNA ?Pre-anesthesia Checklist: Patient identified, Emergency Drugs available, Suction available and Patient being monitored ?Patient Re-evaluated:Patient Re-evaluated prior to induction ?Oxygen Delivery Method: Circle System Utilized ?Preoxygenation: Pre-oxygenation with 100% oxygen ?Induction Type: IV induction ?Ventilation: Mask ventilation without difficulty ?LMA: LMA inserted ?LMA Size: 4.0 ?Number of attempts: 1 ?Airway Equipment and Method: Bite block ?Placement Confirmation: positive ETCO2 ?Tube secured with: Tape ?Dental Injury: Teeth and Oropharynx as per pre-operative assessment  ? ? ? ? ?

## 2021-07-27 NOTE — Progress Notes (Signed)
61 y.o. [id number] Single Caucasian female here for annual exam.   ? ?Status post hysteroscopy with benign polypectomy 07/27/21. ?No pain following surgery.  ?Minimal bleeding after surgery. ?Had low K and elevated creatinine prior to surgery. ? ?Has urinary frequency and urgency with leakage. 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  ?Voiding every 1.5 hours. ?Up twice a night.  ?Leaks with cough, laugh, and sneeze.  ? ?3 - 5 caffeine beverages per day.  ? ?PCP:   Creola Corn, MD ? ?Patient's last menstrual period was 05/27/2016.     ?  ?    ?Sexually active: No.  ?The current method of family planning is post menopausal status.    ?Exercising: No.   ?Smoker:  no ? ?Health Maintenance: ?Pap:  06-24-21 normal neg HPV--12-29-16 normal ?History of abnormal Pap:  yes many yrs ago ?MMG:  05-08-18 Normal Bi RADS 1 Cat.A.  Has appointment next week.  ?Colonoscopy:  12-08-15 Normal ?BMD:   Never  Result   ?TDaP:  UTD ?Gardasil:   no ?SWH:QPRFF ?Hep C:Never ?Screening Labs:  Hb today: PCP, Urine today: PCP ? ? reports that she has never smoked. She has never used smokeless tobacco. She reports that she does not currently use alcohol. She reports that she does not use drugs. ? ?Past Medical History:  ?Diagnosis Date  ? Anemia 2014  ? Anxiety 2023  ? Patient is taking Buspar.  ? Cervical dysplasia   ? COVID-19 01/2020  ? Probable COVID. Patient never took a Covid test. She had flu-like symptoms and loss of taste.  ? Headache   ? migraines occasionally per pt on 07/24/21  ? Hypothyroidism   ? Patient is on Synthroid.  ? Swelling of ankle   ? Bil swelling in feet and ankles  ? Thyroid disease   ? Hypothyroid  ? Wears glasses   ? ? ?Past Surgical History:  ?Procedure Laterality Date  ? CERVICAL CONE BIOPSY  03/30/1995  ? CIN II  ? COLONOSCOPY  12/08/2015  ? DILATATION & CURETTAGE/HYSTEROSCOPY WITH MYOSURE N/A 07/27/2021  ? Procedure: DILATATION & CURETTAGE/HYSTEROSCOPY WITH MYOSURE;  Surgeon: Patton Salles, MD;  Location: [facility] - [place];  Service: Gynecology;  Laterality: N/A;  ? TUBAL LIGATION    ? around 30 years ago  ? ? ?Current Outpatient Medications  ?Medication Sig Dispense Refill  ? busPIRone (BUSPAR) 10 MG tablet Take 20 mg by mouth at bedtime.    ? hydrochlorothiazide (HYDRODIURIL) 25 MG tablet Take 1 tablet by mouth daily.    ? ibuprofen (ADVIL) 400 MG tablet Take 1 tablet (400 mg total) by mouth every 6 (six) hours as needed. 30 tablet 0  ? levothyroxine (SYNTHROID) 100 MCG tablet Take 1 tablet (100 mcg total) by mouth daily before breakfast. 90 tablet 1  ? ?No current facility-administered medications for this visit.  ? ? ?Family History  ?Problem Relation Age of Onset  ? Hyperlipidemia Mother   ? Hypertension Mother   ? Thyroid disease Mother   ? Glaucoma Mother   ? Melanoma Father   ? Hyperlipidemia Father   ? Hypertension Father   ? Heart disease Father   ? Hypertension Brother   ? Colon cancer Neg Hx   ? Esophageal cancer Neg Hx   ? Pancreatic cancer Neg Hx   ? Rectal cancer Neg Hx   ? Stomach cancer Neg Hx   ? ? ?Review of Systems  ?All other systems reviewed and are negative. ? ?Exam:   ?  BP 122/82 (BP Location: Right Arm, Patient Position: Sitting, Cuff Size: Large)   Pulse 82   Ht 5\' 8"  (1.727 m)   Wt 269 lb (122 kg)   LMP 05/27/2016   SpO2 98%   BMI 40.90 kg/m?     ?General appearance: alert, cooperative and appears stated age ?Head: normocephalic, without obvious abnormality, atraumatic ?Neck: no adenopathy, supple, symmetrical, trachea midline and thyroid normal to inspection and palpation ?Lungs: clear to auscultation bilaterally ?Breasts: normal appearance, no masses or tenderness, No nipple retraction or dimpling, No nipple discharge or bleeding, No axillary adenopathy ?Heart: regular rate and rhythm ?Abdomen: soft, non-tender; no masses, no organomegaly ?Extremities: extremities normal, atraumatic, no cyanosis or edema ?Skin: skin color, texture, turgor normal. No rashes or lesions ?Lymph nodes: cervical,  supraclavicular, and axillary nodes normal. ?Neurologic: grossly normal ? ?Pelvic: External genitalia:  no lesions ?             No abnormal inguinal nodes palpated. ?             Urethra:  normal appearing urethra with no masses, tenderness or lesions ?             Bartholins and Skenes: normal    ?             Vagina: normal appearing vagina with normal color and discharge, no lesions ?             Cervix: no lesions ?             Pap taken: no ?Bimanual Exam:  Uterus:  normal size, contour, position, consistency, mobility, non-tender ?             Adnexa: no mass, fullness, tenderness ?             Rectal exam: yes.  Confirms. ?             Anus:  normal sphincter tone, no lesions ? ?Chaperone was present for exam:  07/27/2016, CMA ? ?Assessment:   ?Well woman visit with gynecologic exam. ?Hx cervical cone biopsy in 1997, CIN II.  ?Postmenopausal bleeding.  Status post recent hysteroscopic polypectomy.  ?Mixed incontinence.  ? ?Plan: ?Mammogram screening discussed. ?Pap and HR HPV already done.  ?Guidelines for Calcium, Vitamin D, regular exercise program including cardiovascular and weight bearing exercise. ?We discussed dietary modification - caffeine and soda reduction, pelvic floor therapy, Kegel's, medication for bladder control and even surgery for stress incontinence.  ?No medication given today.  ?BMP. ?Follow up annually and prn.  ? ?After visit summary provided.  ? ? ?  ?

## 2021-07-28 LAB — SURGICAL PATHOLOGY

## 2021-07-29 ENCOUNTER — Encounter (HOSPITAL_BASED_OUTPATIENT_CLINIC_OR_DEPARTMENT_OTHER): Payer: Self-pay | Admitting: Obstetrics and Gynecology

## 2021-08-07 ENCOUNTER — Ambulatory Visit (INDEPENDENT_AMBULATORY_CARE_PROVIDER_SITE_OTHER): Payer: No Typology Code available for payment source | Admitting: Obstetrics and Gynecology

## 2021-08-07 ENCOUNTER — Encounter: Payer: Self-pay | Admitting: Obstetrics and Gynecology

## 2021-08-07 VITALS — BP 122/82 | HR 82 | Ht 68.0 in | Wt 269.0 lb

## 2021-08-07 DIAGNOSIS — R7989 Other specified abnormal findings of blood chemistry: Secondary | ICD-10-CM | POA: Diagnosis not present

## 2021-08-07 DIAGNOSIS — Z01419 Encounter for gynecological examination (general) (routine) without abnormal findings: Secondary | ICD-10-CM

## 2021-08-07 DIAGNOSIS — E876 Hypokalemia: Secondary | ICD-10-CM | POA: Diagnosis not present

## 2021-08-07 DIAGNOSIS — N3946 Mixed incontinence: Secondary | ICD-10-CM

## 2021-08-07 NOTE — Patient Instructions (Addendum)
EXERCISE AND DIET:  We recommended that you start or continue a regular exercise program for good health. Regular exercise means any activity that makes your heart beat faster and makes you sweat.  We recommend exercising at least 30 minutes per day at least 3 days a week, preferably 4 or 5.  We also recommend a diet low in fat and sugar.  Inactivity, poor dietary choices and obesity can cause diabetes, heart attack, stroke, and kidney damage, among others.   ? ?ALCOHOL AND SMOKING:  Women should limit their alcohol intake to no more than 7 drinks/beers/glasses of wine (combined, not each!) per week. Moderation of alcohol intake to this level decreases your risk of breast cancer and liver damage. And of course, no recreational drugs are part of a healthy lifestyle.  And absolutely no smoking or even second hand smoke. Most people know smoking can cause heart and lung diseases, but did you know it also contributes to weakening of your bones? Aging of your skin?  Yellowing of your teeth and nails? ? ?CALCIUM AND VITAMIN D:  Adequate intake of calcium and Vitamin D are recommended.  The recommendations for exact amounts of these supplements seem to change often, but generally speaking 600 mg of calcium (either carbonate or citrate) and 800 units of Vitamin D per day seems prudent. Certain women may benefit from higher intake of Vitamin D.  If you are among these women, your doctor will have told you during your visit.   ? ?PAP SMEARS:  Pap smears, to check for cervical cancer or precancers,  have traditionally been done yearly, although recent scientific advances have shown that most women can have pap smears less often.  However, every woman still should have a physical exam from her gynecologist every year. It will include a breast check, inspection of the vulva and vagina to check for abnormal growths or skin changes, a visual exam of the cervix, and then an exam to evaluate the size and shape of the uterus and  ovaries.  And after 59 years of age, a rectal exam is indicated to check for rectal cancers. We will also provide age appropriate advice regarding health maintenance, like when you should have certain vaccines, screening for sexually transmitted diseases, bone density testing, colonoscopy, mammograms, etc.  ? ?MAMMOGRAMS:  All women over 40 years old should have a yearly mammogram. Many facilities now offer a "3D" mammogram, which may cost around $50 extra out of pocket. If possible,  we recommend you accept the option to have the 3D mammogram performed.  It both reduces the number of women who will be called back for extra views which then turn out to be normal, and it is better than the routine mammogram at detecting truly abnormal areas.   ? ?COLONOSCOPY:  Colonoscopy to screen for colon cancer is recommended for all women at age 50.  We know, you hate the idea of the prep.  We agree, BUT, having colon cancer and not knowing it is worse!!  Colon cancer so often starts as a polyp that can be seen and removed at colonscopy, which can quite literally save your life!  And if your first colonoscopy is normal and you have no family history of colon cancer, most women don't have to have it again for 10 years.  Once every ten years, you can do something that may end up saving your life, right?  We will be happy to help you get it scheduled when you are ready.    Be sure to check your insurance coverage so you understand how much it will cost.  It may be covered as a preventative service at no cost, but you should check your particular policy.   ? ?Calcium Content in Foods ?Calcium is the most abundant mineral in the body. Most of the body's calcium supply is stored in bones and teeth. Calcium helps many parts of the body function normally, including: ?Blood and blood vessels. ?Nerves. ?Hormones. ?Muscles. ?Bones and teeth. ?When your calcium stores are low, you may be at risk for low bone mass, bone loss, and broken bones  (fractures). When you get enough calcium, it helps to support strong bones and teeth throughout your life. ?Calcium is especially important for: ?Children during growth spurts. ?Girls during adolescence. ?Women who are pregnant or breastfeeding. ?Women after their menstrual cycle stops (postmenopause). ?Women whose menstrual cycle has stopped due to anorexia nervosa or regular intense exercise. ?People who cannot eat or digest dairy products. ?Vegans. ?Recommended daily amounts of calcium: ?Women (ages 19 to 50): 1,000 mg per day. ?Women (ages 51 and older): 1,200 mg per day. ?Men (ages 19 to 70): 1,000 mg per day. ?Men (ages 71 and older): 1,200 mg per day. ?Women (ages 9 to 18): 1,300 mg per day. ?Men (ages 9 to 18): 1,300 mg per day. ?General information ?Eat foods that are high in calcium. Try to get most of your calcium from food. ?Some people may benefit from taking calcium supplements. Check with your health care provider or diet and nutrition specialist (dietitian) before starting any calcium supplements. Calcium supplements may interact with certain medicines. Too much calcium may cause other health problems, such as constipation and kidney stones. ?For the body to absorb calcium, it needs vitamin D. Sources of vitamin D include: ?Skin exposure to direct sunlight. ?Foods, such as egg yolks, liver, mushrooms, saltwater fish, and fortified milk. ?Vitamin D supplements. Check with your health care provider or dietitian before starting any vitamin D supplements. ?What foods are high in calcium? ? ?Foods that are high in calcium contain more than 100 milligrams per serving. ?Fruits ?Fortified orange juice or other fruit juice, 300 mg per 8 oz serving. ?Vegetables ?Collard greens, 360 mg per 8 oz serving. ?Kale, 100 mg per 8 oz serving. ?Bok choy, 160 mg per 8 oz serving. ?Grains ?Fortified ready-to-eat cereals, 100 to 1,000 mg per 8 oz serving. ?Fortified frozen waffles, 200 mg in 2 waffles. ?Oatmeal, 140 mg in  1 cup. ?Meats and other proteins ?Sardines, canned with bones, 325 mg per 3 oz serving. ?Salmon, canned with bones, 180 mg per 3 oz serving. ?Canned shrimp, 125 mg per 3 oz serving. ?Baked beans, 160 mg per 4 oz serving. ?Tofu, firm, made with calcium sulfate, 253 mg per 4 oz serving. ?Dairy ?Yogurt, plain, low-fat, 310 mg per 6 oz serving. ?Nonfat milk, 300 mg per 8 oz serving. ?American cheese, 195 mg per 1 oz serving. ?Cheddar cheese, 205 mg per 1 oz serving. ?Cottage cheese 2%, 105 mg per 4 oz serving. ?Fortified soy, rice, or almond milk, 300 mg per 8 oz serving. ?Mozzarella, part skim, 210 mg per 1 oz serving. ?The items listed above may not be a complete list of foods high in calcium. Actual amounts of calcium may be different depending on processing. Contact a dietitian for more information. ?What foods are lower in calcium? ?Foods that are lower in calcium contain 50 mg or less per serving. ?Fruits ?Apple, about 6 mg. ?Banana, about 12 mg. ?  Vegetables ?Lettuce, 19 mg per 2 oz serving. ?Tomato, about 11 mg. ?Grains ?Rice, 4 mg per 6 oz serving. ?Boiled potatoes, 14 mg per 8 oz serving. ?White bread, 6 mg per slice. ?Meats and other proteins ?Egg, 27 mg per 2 oz serving. ?Red meat, 7 mg per 4 oz serving. ?Chicken, 17 mg per 4 oz serving. ?Fish, cod, or trout, 20 mg per 4 oz serving. ?Dairy ?Cream cheese, regular, 14 mg per 1 Tbsp serving. ?Brie cheese, 50 mg per 1 oz serving. ?Parmesan cheese, 70 mg per 1 Tbsp serving. ?The items listed above may not be a complete list of foods lower in calcium. Actual amounts of calcium may be different depending on processing. Contact a dietitian for more information. ?Summary ?Calcium is an important mineral in the body because it affects many functions. Getting enough calcium helps support strong bones and teeth throughout your life. ?Try to get most of your calcium from food. ?Calcium supplements may interact with certain medicines. Check with your health care provider  or dietitian before starting any calcium supplements. ?This information is not intended to replace advice given to you by your health care provider. Make sure you discuss any questions you have with your h

## 2021-08-08 LAB — BASIC METABOLIC PANEL
BUN: 18 mg/dL (ref 7–25)
CO2: 28 mmol/L (ref 20–32)
Calcium: 9 mg/dL (ref 8.6–10.4)
Chloride: 104 mmol/L (ref 98–110)
Creat: 0.87 mg/dL (ref 0.50–1.03)
Glucose, Bld: 92 mg/dL (ref 65–99)
Potassium: 3.9 mmol/L (ref 3.5–5.3)
Sodium: 140 mmol/L (ref 135–146)

## 2021-08-11 ENCOUNTER — Ambulatory Visit
Admission: RE | Admit: 2021-08-11 | Discharge: 2021-08-11 | Disposition: A | Discharge status: Home / Self Care | Payer: No Typology Code available for payment source | Source: Ambulatory Visit | Attending: Internal Medicine | Admitting: Internal Medicine

## 2021-08-11 DIAGNOSIS — Z1231 Encounter for screening mammogram for malignant neoplasm of breast: Secondary | ICD-10-CM

## 2021-09-01 ENCOUNTER — Encounter: Payer: No Typology Code available for payment source | Admitting: Obstetrics and Gynecology

## 2022-06-04 ENCOUNTER — Other Ambulatory Visit: Payer: Self-pay | Admitting: Internal Medicine

## 2022-06-04 DIAGNOSIS — R109 Unspecified abdominal pain: Secondary | ICD-10-CM

## 2022-07-08 ENCOUNTER — Ambulatory Visit
Admission: RE | Admit: 2022-07-08 | Discharge: 2022-07-08 | Disposition: A | Discharge status: Home / Self Care | Payer: 59 | Source: Ambulatory Visit | Attending: Internal Medicine | Admitting: Internal Medicine

## 2022-07-08 DIAGNOSIS — R109 Unspecified abdominal pain: Secondary | ICD-10-CM

## 2022-09-01 ENCOUNTER — Other Ambulatory Visit: Payer: Self-pay | Admitting: Internal Medicine

## 2022-09-01 DIAGNOSIS — Z1231 Encounter for screening mammogram for malignant neoplasm of breast: Secondary | ICD-10-CM

## 2022-09-22 ENCOUNTER — Ambulatory Visit
Admission: RE | Admit: 2022-09-22 | Discharge: 2022-09-22 | Disposition: A | Discharge status: Home / Self Care | Payer: 59 | Source: Ambulatory Visit | Attending: Internal Medicine | Admitting: Internal Medicine

## 2022-09-22 DIAGNOSIS — Z1231 Encounter for screening mammogram for malignant neoplasm of breast: Secondary | ICD-10-CM

## 2022-12-27 ENCOUNTER — Ambulatory Visit: Payer: No Typology Code available for payment source | Admitting: Obstetrics and Gynecology

## 2023-06-07 LAB — LAB REPORT - SCANNED
EGFR (Non-African Amer.): 73.2
Free T4: 1.26 ng/dL
TSH: 1.23

## 2023-06-30 ENCOUNTER — Ambulatory Visit: Admitting: Dietician

## 2023-07-01 ENCOUNTER — Ambulatory Visit

## 2023-07-08 ENCOUNTER — Ambulatory Visit

## 2023-08-30 ENCOUNTER — Other Ambulatory Visit: Payer: Self-pay | Admitting: Internal Medicine

## 2023-08-30 DIAGNOSIS — Z1231 Encounter for screening mammogram for malignant neoplasm of breast: Secondary | ICD-10-CM

## 2023-09-23 ENCOUNTER — Ambulatory Visit

## 2023-09-27 ENCOUNTER — Ambulatory Visit
Admission: RE | Admit: 2023-09-27 | Discharge: 2023-09-27 | Disposition: A | Discharge status: Home / Self Care | Source: Ambulatory Visit | Attending: Internal Medicine | Admitting: Internal Medicine

## 2023-09-27 DIAGNOSIS — Z1231 Encounter for screening mammogram for malignant neoplasm of breast: Secondary | ICD-10-CM

## 2023-11-08 ENCOUNTER — Encounter: Payer: Self-pay | Admitting: Dietician

## 2023-11-08 ENCOUNTER — Encounter: Attending: Internal Medicine | Admitting: Dietician

## 2023-11-08 VITALS — Wt 265.9 lb

## 2023-11-08 DIAGNOSIS — E669 Obesity, unspecified: Secondary | ICD-10-CM | POA: Insufficient documentation

## 2023-11-08 NOTE — Progress Notes (Signed)
 Medical Nutrition Therapy  Appointment Start time:  [phone]  Appointment End time:  0900  Primary concerns today: healthier diet   Referral diagnosis: E66.9 Preferred learning style: no preference indicated Learning readiness: ready   NUTRITION ASSESSMENT   Anthropometrics   Wt 11/08/23: 265.9 lb  Clinical Medical Hx: anemia, anxiety, thyroid  disease, vitamin d  deficiency, fatty liver Medications: reviewed; levothyroxine  Labs: 06/07/23: vit D 16.2,  Notable Signs/Symptoms: none reported Food Allergies: none  Lifestyle & Dietary Hx  Pt reports she has been tying to make changes to her diet including cutting out sweet tea and soda. Pt reports she was drinking 1 pepsi daily and around 3 glasses of sweet tea. Pt states she has lost weight since then. Pt states she has also been trying not to eat after 6pm. Pt reports in April/May she had started walking on her treadmill at home but stopped because she has been care giving for a friend in the evening. 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   Pt reports her granddaughter and granddaughters boyfriend live with her. Pt reports they do the cooking and granddaughter will sometimes pack her lunch. Pt states she packs lunch or eats out about 50/50. Pt reports she has been trying to get a grilled chicken salad or sandwich when eating out for lunch. Pt reports she also works at [employer] on Saturdays.   Pt states she was taking vitamin D  and magnesium but has been inconsistent.   Accepted fruits: pineapple, strawberries, melons (watermelon, canteloupe, honeydew), apple, banana, oranges, peaches  Accepted vegetables: green beans, salad greens, cucumber, broccoli, squash, okra, turnip/mustard/collard greens, carrots  Estimated daily fluid intake: 48-64 oz Supplements: none currently Sleep: 11:30pm-5:30am.  Stress / self-care: moderate stress Current average weekly  physical activity: ADLs  24-Hr Dietary Recall First Meal: pack of crackers OR eggs and bacon OR 3 eggs and cheese Snack: none Second Meal: country BBQ: grilled chicken sandwich/salad OR pizza OR granddaughter packs:  Snack: none Third Meal: bbq chicken and potatoes OR none Snack: none Beverages: water   NUTRITION DIAGNOSIS  NB-1.1 Food and nutrition-related knowledge deficit As related to lack of prior education by a registered dietitian.  As evidenced by pt report.   NUTRITION INTERVENTION  Nutrition education (E-1) on the following topics:   Sleep and Nutrition Impact of sleep on nutrition: Inadequate sleep can have significant impacts on nutrition by increasing appetite and cravings for high-calorie foods, altering food choices, and disrupting metabolic processes. This can lead to weight gain, poor glucose management, and impaired nutrient absorption. Additionally, sleep deprivation often results in reduced physical activity, further affecting overall health. Tips for better sleep: To improve sleep quality, maintain a consistent sleep schedule and create a relaxing bedtime routine. Optimize your sleep environment by keeping it cool, dark, and quiet, and limit screen time before bed. Be mindful of food and drink intake, engage in regular physical activity, and manage stress effectively. By prioritizing better sleep habits, you can positively influence your nutrition and overall well-being.  Plate Method/Food Groups Fruits & Vegetables: Aim to fill half your plate with a variety of fruits and vegetables. They are rich in vitamins, minerals, and fiber, and can help reduce the risk of chronic diseases. Choose a colorful assortment of fruits and vegetables to ensure you get a wide range of nutrients. Grains and Starches: Make at least half of your grain choices whole grains, such as brown rice,  whole wheat bread, and oats. Whole grains provide fiber, which aids in digestion and healthy  cholesterol levels. Aim for whole forms of starchy vegetables such as potatoes, sweet potatoes, beans, peas, and corn, which are fiber rich and provide many vitamins and minerals.  Protein: Incorporate lean sources of protein, such as poultry, fish, beans, nuts, and seeds, into your meals. Protein is essential for building and repairing tissues, staying full, balancing blood sugar, as well as supporting immune function. Dairy: Include low-fat or fat-free dairy products like milk, yogurt, and cheese in your diet. Dairy foods are excellent sources of calcium and vitamin D , which are crucial for bone health.   Handouts Provided Include  Plate Method  Learning Style & Readiness for Change Teaching method utilized: Visual & Auditory  Demonstrated degree of understanding via: Teach Back  Barriers to learning/adherence to lifestyle change: none  Goals Established by Pt  Goal 1: eat 1 fruit and 1 non-starchy vegetable every day.   Goal 2: go to bed by 11pm daily.   Goal 3: walk on the treadmill for at least a mile on Sundays.    MONITORING & EVALUATION Dietary intake, weekly physical activity, and follow up in 6-8 weeks.  Next Steps  Patient is to call for questions.

## 2023-11-08 NOTE — Patient Instructions (Signed)
 Goals Established by Pt  Goal 1: eat 1 fruit and 1 non-starchy vegetable every day.   Goal 2: go to bed by 11pm daily.   Goal 3: walk on the treadmill for at least a mile on Sundays.

## 2023-12-18 IMAGING — MG MM DIGITAL SCREENING BILAT W/ TOMO AND CAD
6 of 10 series · 6 of 30 positions shown · non-contrast
Comparison: Previous exam(s).

ACR Breast Density Category a: The breast tissue is almost entirely
fatty.

CLINICAL DATA: Screening.

EXAM:
DIGITAL SCREENING BILATERAL MAMMOGRAM WITH TOMOSYNTHESIS AND CAD
TECHNIQUE: Bilateral screening digital craniocaudal and mediolateral oblique
mammograms were obtained. Bilateral screening digital breast
tomosynthesis was performed. The images were evaluated with
computer-aided detection.

[L MLO synth-2D]
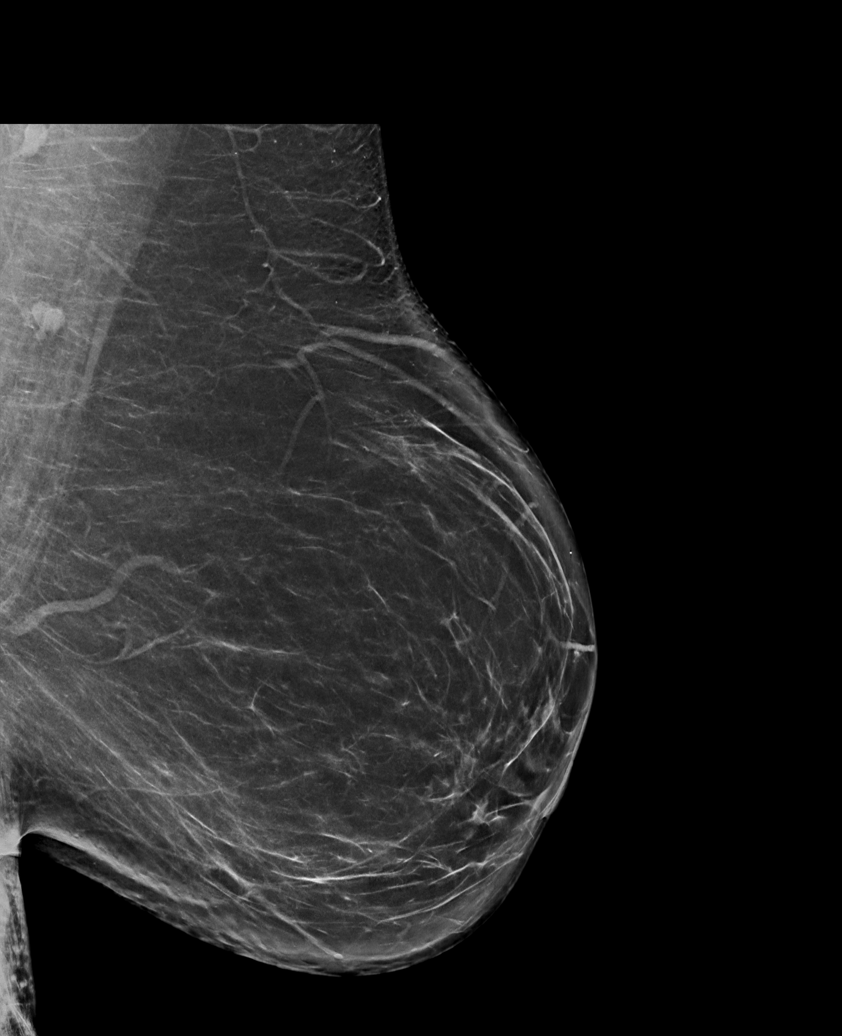

[R CC synth-2D (1 of 2)]
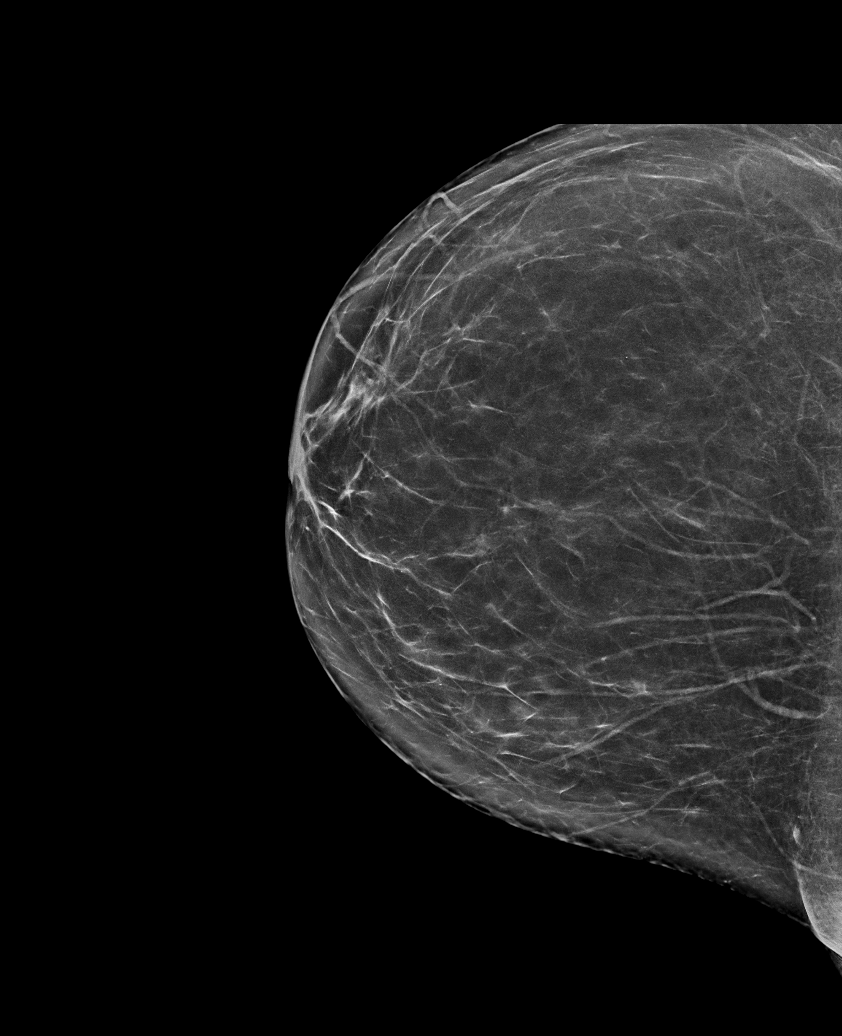

[L CC synth-2D]
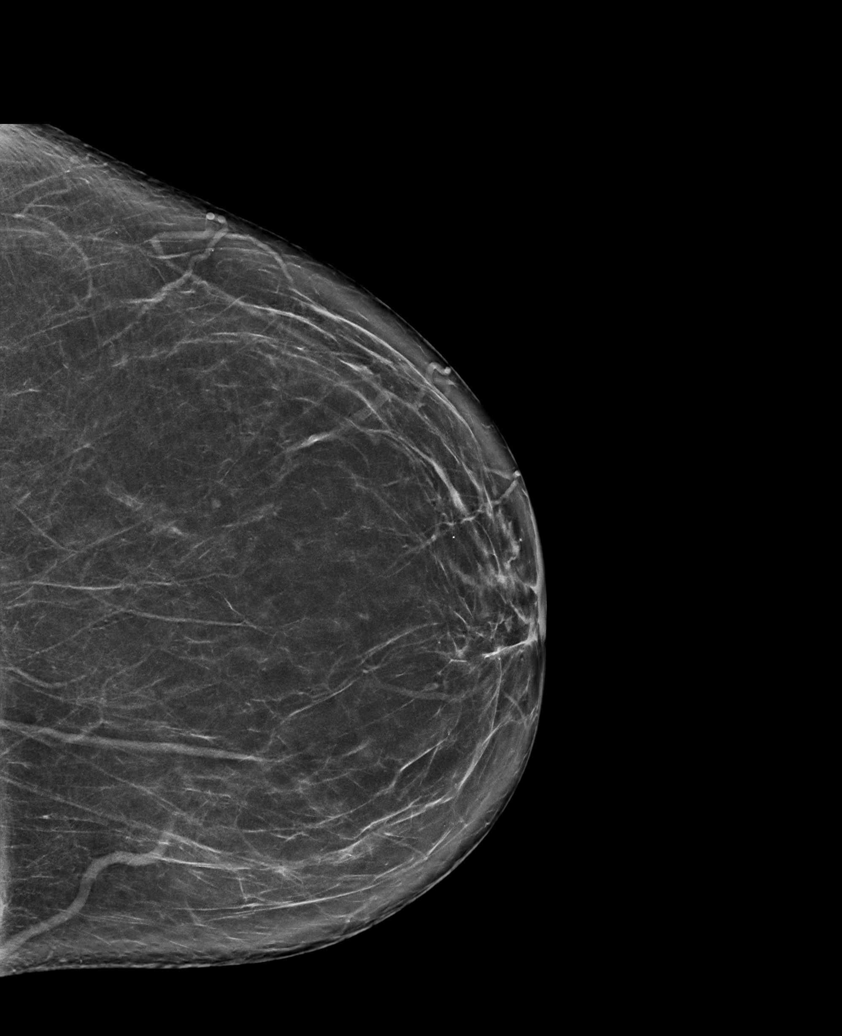

[R CC synth-2D (2 of 2)]
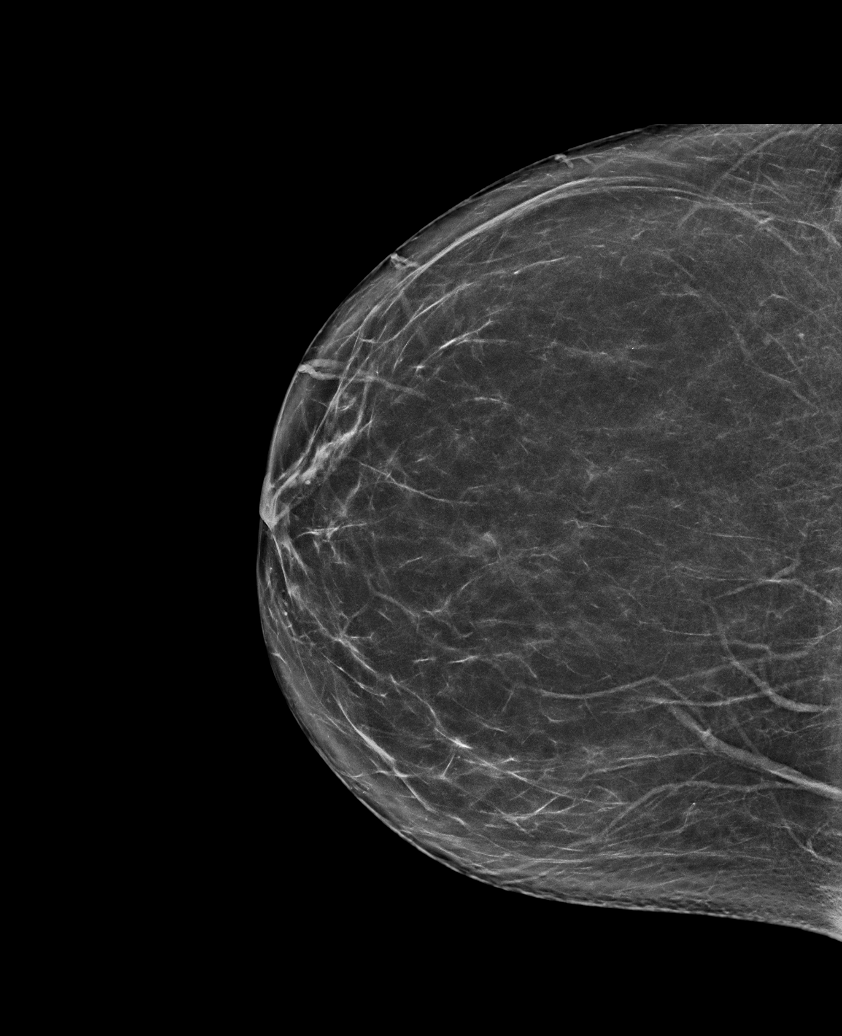

[R MLO synth-2D]
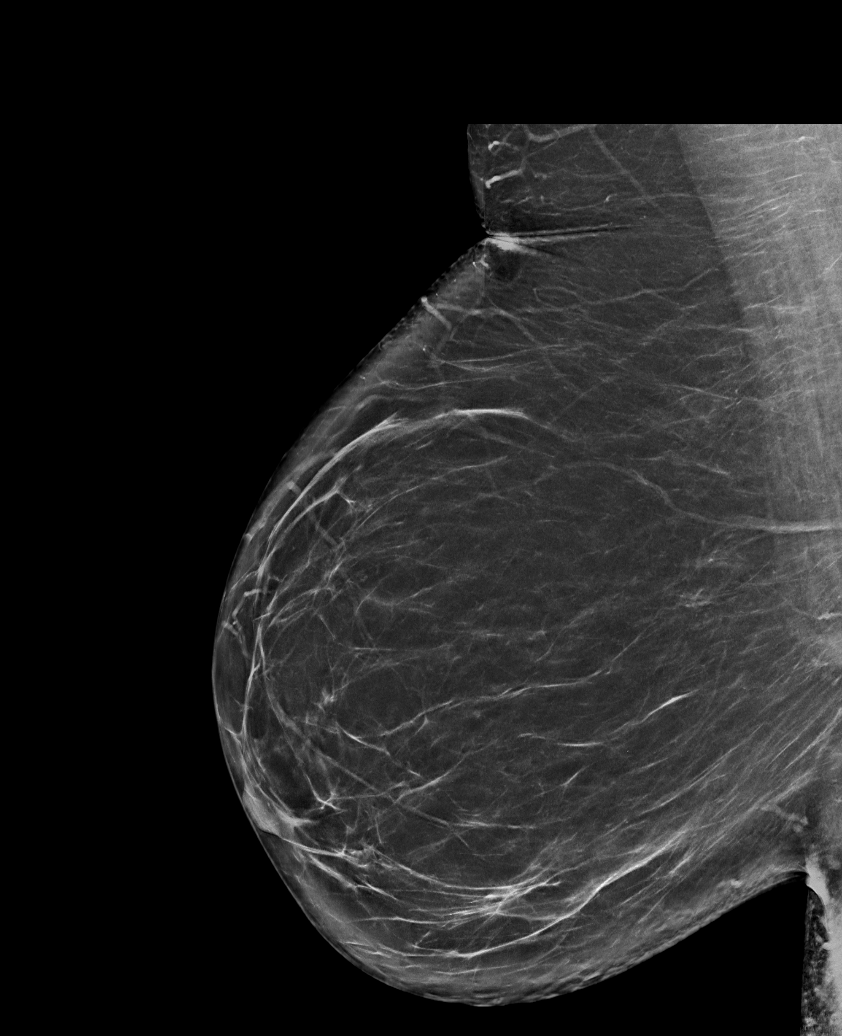

[L MLO tomo · tomo slice 45/89.0]
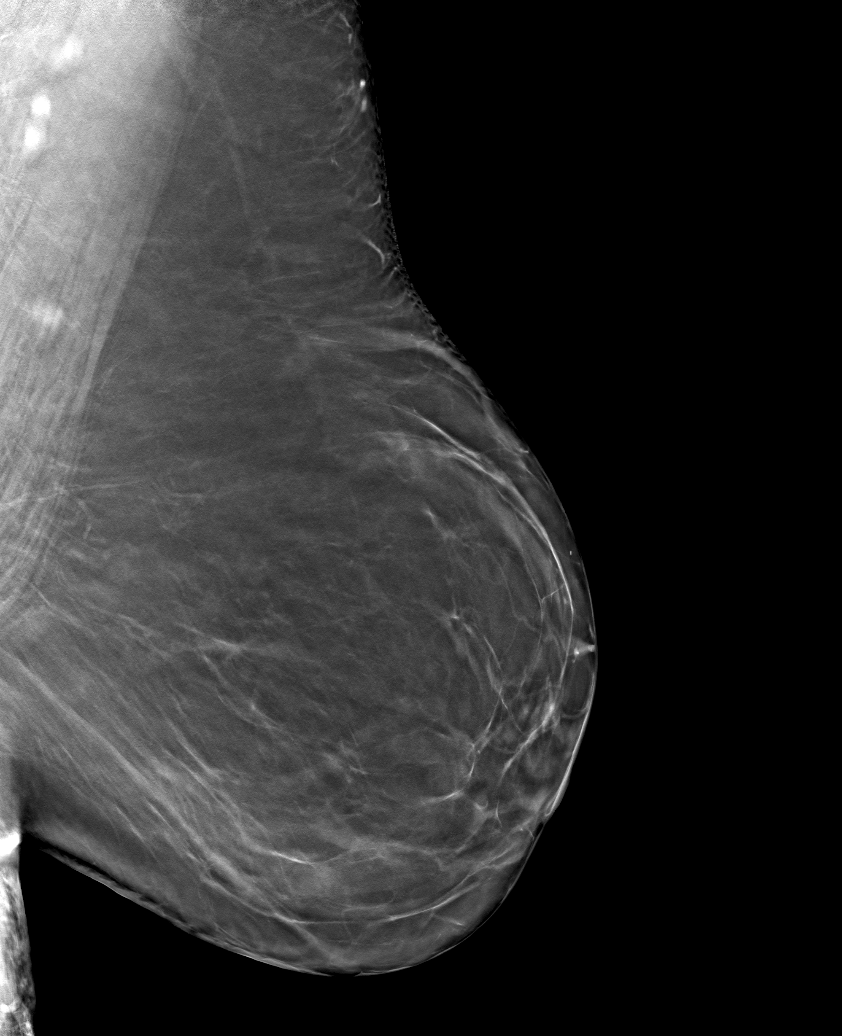

[6 of 30 positions shown; findings below may reference images not displayed]

FINDINGS: There are no findings suspicious for malignancy.
IMPRESSION: No mammographic evidence of malignancy. A result letter of this
screening mammogram will be mailed directly to the patient.

RECOMMENDATION:
Screening mammogram in one year. (Code:0E-3-N98)

BI-RADS CATEGORY  1: Negative.

## 2024-01-04 ENCOUNTER — Ambulatory Visit: Admitting: Dietician
